# Patient Record
Sex: Female | Born: 1940 | Race: Black or African American | Hispanic: No | Smoking: Former smoker
Health system: Southern US, Community
[De-identification: ages and names within clinical notes are randomized; demographics above are authoritative.]

## PROBLEM LIST (undated history)

## (undated) ENCOUNTER — Emergency Department (HOSPITAL_COMMUNITY): Discharge status: Against Medical Advice | Payer: Medicare Other

## (undated) DIAGNOSIS — I10 Essential (primary) hypertension: Secondary | ICD-10-CM

## (undated) DIAGNOSIS — G43909 Migraine, unspecified, not intractable, without status migrainosus: Secondary | ICD-10-CM

## (undated) DIAGNOSIS — M1990 Unspecified osteoarthritis, unspecified site: Secondary | ICD-10-CM

## (undated) DIAGNOSIS — I82409 Acute embolism and thrombosis of unspecified deep veins of unspecified lower extremity: Secondary | ICD-10-CM

## (undated) DIAGNOSIS — I8390 Asymptomatic varicose veins of unspecified lower extremity: Secondary | ICD-10-CM

## (undated) DIAGNOSIS — R269 Unspecified abnormalities of gait and mobility: Secondary | ICD-10-CM

## (undated) DIAGNOSIS — F419 Anxiety disorder, unspecified: Secondary | ICD-10-CM

## (undated) DIAGNOSIS — R413 Other amnesia: Principal | ICD-10-CM

## (undated) DIAGNOSIS — E785 Hyperlipidemia, unspecified: Secondary | ICD-10-CM

## (undated) HISTORY — DX: Other amnesia: R41.3

## (undated) HISTORY — DX: Unspecified osteoarthritis, unspecified site: M19.90

## (undated) HISTORY — DX: Migraine, unspecified, not intractable, without status migrainosus: G43.909

## (undated) HISTORY — DX: Asymptomatic varicose veins of unspecified lower extremity: I83.90

## (undated) HISTORY — DX: Unspecified abnormalities of gait and mobility: R26.9

## (undated) HISTORY — PX: CHOLECYSTECTOMY: SHX55

## (undated) HISTORY — DX: Anxiety disorder, unspecified: F41.9

## (undated) HISTORY — PX: HERNIA REPAIR: SHX51

## (undated) HISTORY — PX: NECK SURGERY: SHX720

---

## 1997-10-13 ENCOUNTER — Ambulatory Visit (HOSPITAL_COMMUNITY): Admission: RE | Admit: 1997-10-13 | Discharge: 1997-10-13 | Payer: Self-pay | Admitting: Gastroenterology

## 1998-03-18 ENCOUNTER — Encounter: Payer: Self-pay | Admitting: Surgery

## 1998-03-19 ENCOUNTER — Ambulatory Visit (HOSPITAL_COMMUNITY): Admission: RE | Admit: 1998-03-19 | Discharge: 1998-03-20 | Payer: Self-pay | Admitting: Surgery

## 1998-03-19 ENCOUNTER — Encounter: Payer: Self-pay | Admitting: Surgery

## 2000-07-31 ENCOUNTER — Ambulatory Visit (HOSPITAL_BASED_OUTPATIENT_CLINIC_OR_DEPARTMENT_OTHER): Admission: RE | Admit: 2000-07-31 | Discharge: 2000-07-31 | Payer: Self-pay | Admitting: Orthopedic Surgery

## 2001-11-28 ENCOUNTER — Encounter: Payer: Self-pay | Admitting: Family Medicine

## 2001-11-28 ENCOUNTER — Ambulatory Visit (HOSPITAL_COMMUNITY): Admission: RE | Admit: 2001-11-28 | Discharge: 2001-11-28 | Payer: Self-pay | Admitting: Family Medicine

## 2002-12-29 ENCOUNTER — Ambulatory Visit (HOSPITAL_COMMUNITY): Admission: RE | Admit: 2002-12-29 | Discharge: 2002-12-29 | Payer: Self-pay | Admitting: Unknown Physician Specialty

## 2004-02-18 ENCOUNTER — Ambulatory Visit: Payer: Self-pay | Admitting: *Deleted

## 2004-03-22 ENCOUNTER — Ambulatory Visit (HOSPITAL_COMMUNITY): Admission: RE | Admit: 2004-03-22 | Discharge: 2004-03-22 | Payer: Self-pay | Admitting: Family Medicine

## 2004-12-08 ENCOUNTER — Ambulatory Visit: Payer: Self-pay | Admitting: Family Medicine

## 2004-12-13 ENCOUNTER — Ambulatory Visit: Payer: Self-pay | Admitting: Family Medicine

## 2005-04-25 ENCOUNTER — Ambulatory Visit (HOSPITAL_COMMUNITY): Admission: RE | Admit: 2005-04-25 | Discharge: 2005-04-25 | Payer: Self-pay | Admitting: Family Medicine

## 2005-05-22 ENCOUNTER — Ambulatory Visit: Payer: Self-pay | Admitting: Family Medicine

## 2005-06-22 ENCOUNTER — Ambulatory Visit: Payer: Self-pay | Admitting: Family Medicine

## 2005-11-09 ENCOUNTER — Ambulatory Visit: Payer: Self-pay | Admitting: Family Medicine

## 2005-12-25 ENCOUNTER — Ambulatory Visit: Payer: Self-pay | Admitting: Family Medicine

## 2006-04-25 ENCOUNTER — Ambulatory Visit: Payer: Self-pay | Admitting: Family Medicine

## 2006-04-30 ENCOUNTER — Ambulatory Visit (HOSPITAL_COMMUNITY): Admission: RE | Admit: 2006-04-30 | Discharge: 2006-04-30 | Payer: Self-pay | Admitting: Family Medicine

## 2006-06-25 ENCOUNTER — Ambulatory Visit: Payer: Self-pay | Admitting: Family Medicine

## 2008-01-08 ENCOUNTER — Ambulatory Visit (HOSPITAL_COMMUNITY): Admission: RE | Admit: 2008-01-08 | Discharge: 2008-01-08 | Payer: Self-pay | Admitting: Family Medicine

## 2009-02-03 ENCOUNTER — Ambulatory Visit (HOSPITAL_COMMUNITY): Admission: RE | Admit: 2009-02-03 | Discharge: 2009-02-03 | Payer: Self-pay | Admitting: Family Medicine

## 2010-02-04 ENCOUNTER — Ambulatory Visit (HOSPITAL_COMMUNITY)
Admission: RE | Admit: 2010-02-04 | Discharge: 2010-02-04 | Payer: Self-pay | Source: Home / Self Care | Attending: Family Medicine | Admitting: Family Medicine

## 2010-03-13 ENCOUNTER — Encounter: Payer: Self-pay | Admitting: Family Medicine

## 2010-07-08 NOTE — Op Note (Signed)
[place]  Patient:    Julia Patel, Julia Patel                       MRN: [id number] Proc. Date: 07/31/00 Attending:  Nicki Reaper, M.D.                           Operative Report  PREOPERATIVE DIAGNOSIS:  Carpal tunnel syndrome left hand.  POSTOPERATIVE DIAGNOSIS:  Carpal tunnel syndrome left hand.  OPERATION:  Release of left carpal tunnel.  SURGEON:  Nicki Reaper, M.D.  ASSISTANT:  Joaquin Courts, R.N.  ANESTHESIA:  Forearm-based IV regional.  ANESTHESIOLOGIST:  Halford Decamp, M.D.  HISTORY:  The patient is a 70 year old female with a history of carpal tunnel syndrome.  EMG nerve conduction is positive, which has not responded to conservative treatment.  PROCEDURE:  The patient was brought to the operating room, where a forearm-based IV regional anesthetic was carried out without difficulty.  She was prepped and draped using Betadine scrubbing solution with the left arm free.  A longitudinal incision was made in the palm and carried down through subcutaneous tissue.  Bleeders were electrocauterized.  The palmar fascia was split.  The superficial palmar arch identified.  The flexor tendon to the ring and little finger identified to the ulnar side of the median nerve.  The carpal retinaculum was incised with sharp dissection.  A right-angle and Sewall retractor were placed between skin and forearm fascia.  The fascia was released for approximately 3 cm proximal to the wrist crease under direct vision.  The canal was explored.  Tenosynovial tissue was moderately thickened.  No further lesions were identified.  The wound was irrigated.  The skin was closed with interrupted 5-0 nylon sutures.  A sterile compressive dressing and splint were applied.  The patient tolerated the procedure well and was taken to the recovery room for observation in satisfactory condition. She is discharged home.  She will return to the [facility] [place] in one  week.  She is discharged on Vicodin and Keflex. DD:  07/31/00 TD:  07/31/00 Job: [id number]

## 2011-01-25 ENCOUNTER — Other Ambulatory Visit (HOSPITAL_COMMUNITY): Payer: Self-pay | Admitting: Family Medicine

## 2011-01-25 DIAGNOSIS — Z139 Encounter for screening, unspecified: Secondary | ICD-10-CM

## 2011-02-09 ENCOUNTER — Ambulatory Visit (HOSPITAL_COMMUNITY)
Admission: RE | Admit: 2011-02-09 | Discharge: 2011-02-09 | Disposition: A | Discharge status: Home / Self Care | Payer: Medicare Other | Source: Ambulatory Visit | Attending: Family Medicine | Admitting: Family Medicine

## 2011-02-09 DIAGNOSIS — Z139 Encounter for screening, unspecified: Secondary | ICD-10-CM

## 2011-02-09 DIAGNOSIS — Z1231 Encounter for screening mammogram for malignant neoplasm of breast: Secondary | ICD-10-CM | POA: Insufficient documentation

## 2011-08-17 NOTE — Patient Instructions (Addendum)
Your procedure is scheduled on: 08/28/2011  Report to [facility] at 730       AM.  Call this number if you have problems the morning of surgery: [phone]   Do not eat food or drink liquids :After Midnight.      Take these medicines the morning of surgery with A SIP OF WATER:none   Do not wear jewelry, make-up or nail polish.  Do not wear lotions, powders, or perfumes. You may wear deodorant.  Do not shave 48 hours prior to surgery.  Do not bring valuables to the hospital.  Contacts, dentures or bridgework may not be worn into surgery.  Leave suitcase in the car. After surgery it may be brought to your room.  For patients admitted to the hospital, checkout time is 11:00 AM the day of discharge.   Patients discharged the day of surgery will not be allowed to drive home.  :     Please read over the following fact sheets that you were given: Coughing and Deep Breathing, Surgical Site Infection Prevention, Anesthesia Post-op Instructions and Care and Recovery After Surgery    Cataract A cataract is a clouding of the lens of the eye. When a lens becomes cloudy, vision is reduced based on the degree and nature of the clouding. Many cataracts reduce vision to some degree. Some cataracts make people more near-sighted as they develop. Other cataracts increase glare. Cataracts that are ignored and become worse can sometimes look white. The white color can be seen through the pupil. CAUSES   Aging. However, cataracts may occur at any age, even in newborns.   Certain drugs.   Trauma to the eye.   Certain diseases such as diabetes.   Specific eye diseases such as chronic inflammation inside the eye or a sudden attack of a rare form of glaucoma.   Inherited or acquired medical problems.  SYMPTOMS   Gradual, progressive drop in vision in the affected eye.   Severe, rapid visual loss. This most often happens when trauma is the cause.  DIAGNOSIS  To detect a cataract, an eye doctor examines  the lens. Cataracts are best diagnosed with an exam of the eyes with the pupils enlarged (dilated) by drops.  TREATMENT  For an early cataract, vision may improve by using different eyeglasses or stronger lighting. If that does not help your vision, surgery is the only effective treatment. A cataract needs to be surgically removed when vision loss interferes with your everyday activities, such as driving, reading, or watching TV. A cataract may also have to be removed if it prevents examination or treatment of another eye problem. Surgery removes the cloudy lens and usually replaces it with a substitute lens (intraocular lens, IOL).  At a time when both you and your doctor agree, the cataract will be surgically removed. If you have cataracts in both eyes, only one is usually removed at a time. This allows the operated eye to heal and be out of danger from any possible problems after surgery (such as infection or poor wound healing). In rare cases, a cataract may be doing damage to your eye. In these cases, your caregiver may advise surgical removal right away. The vast majority of people who have cataract surgery have better vision afterward. HOME CARE INSTRUCTIONS  If you are not planning surgery, you may be asked to do the following:  Use different eyeglasses.   Use stronger or brighter lighting.   Ask your eye doctor about reducing your  medicine dose or changing medicines if it is thought that a medicine caused your cataract. Changing medicines does not make the cataract go away on its own.   Become familiar with your surroundings. Poor vision can lead to injury. Avoid bumping into things on the affected side. You are at a higher risk for tripping or falling.   Exercise extreme care when driving or operating machinery.   Wear sunglasses if you are sensitive to bright light or experiencing problems with glare.  SEEK IMMEDIATE MEDICAL CARE IF:   You have a worsening or sudden vision loss.    You notice redness, swelling, or increasing pain in the eye.   You have a fever.  Document Released: 02/06/2005 Document Revised: 01/26/2011 Document Reviewed: 09/30/2010 [facility] Patient Information 2012 [place], [place].PATIENT INSTRUCTIONS POST-ANESTHESIA  IMMEDIATELY FOLLOWING SURGERY:  Do not drive or operate machinery for the first twenty four hours after surgery.  Do not make any important decisions for twenty four hours after surgery or while taking narcotic pain medications or sedatives.  If you develop intractable nausea and vomiting or a severe headache please notify your doctor immediately.  FOLLOW-UP:  Please make an appointment with your surgeon as instructed. You do not need to follow up with anesthesia unless specifically instructed to do so.  WOUND CARE INSTRUCTIONS (if applicable):  Keep a dry clean dressing on the anesthesia/puncture wound site if there is drainage.  Once the wound has quit draining you may leave it open to air.  Generally you should leave the bandage intact for twenty four hours unless there is drainage.  If the epidural site drains for more than 36-48 hours please call the anesthesia department.  QUESTIONS?:  Please feel free to call your physician or the hospital operator if you have any questions, and they will be happy to assist you.

## 2011-08-18 ENCOUNTER — Encounter (HOSPITAL_COMMUNITY): Payer: Self-pay

## 2011-08-18 ENCOUNTER — Other Ambulatory Visit: Payer: Self-pay

## 2011-08-18 ENCOUNTER — Encounter (HOSPITAL_COMMUNITY): Payer: Self-pay | Admitting: Pharmacy Technician

## 2011-08-18 ENCOUNTER — Encounter (HOSPITAL_COMMUNITY)
Admission: RE | Admit: 2011-08-18 | Discharge: 2011-08-18 | Disposition: A | Discharge status: Home / Self Care | Payer: Medicare Other | Source: Ambulatory Visit | Attending: Ophthalmology | Admitting: Ophthalmology

## 2011-08-18 HISTORY — DX: Essential (primary) hypertension: I10

## 2011-08-18 HISTORY — DX: Acute embolism and thrombosis of unspecified deep veins of unspecified lower extremity: I82.409

## 2011-08-18 HISTORY — DX: Hyperlipidemia, unspecified: E78.5

## 2011-08-18 LAB — BASIC METABOLIC PANEL
BUN: 16 mg/dL (ref 6–23)
CO2: 30 mEq/L (ref 19–32)
Calcium: 9.8 mg/dL (ref 8.4–10.5)
Chloride: 101 mEq/L (ref 96–112)
GFR calc non Af Amer: >90 mL/min (ref >90)
Glucose, Bld: 87 mg/dL (ref 70–99)

## 2011-08-25 MED ORDER — CYCLOPENTOLATE-PHENYLEPHRINE 0.2-1 % OP SOLN
OPHTHALMIC | Status: AC
  Filled 2011-08-25: qty 2

## 2011-08-25 MED ORDER — PHENYLEPHRINE HCL 2.5 % OP SOLN
OPHTHALMIC | Status: AC
  Filled 2011-08-25: qty 2

## 2011-08-25 MED ORDER — TETRACAINE HCL 0.5 % OP SOLN
OPHTHALMIC | Status: AC
  Filled 2011-08-25: qty 2

## 2011-08-25 MED ORDER — LIDOCAINE HCL (PF) 1 % IJ SOLN
INTRAMUSCULAR | Status: AC
  Filled 2011-08-25: qty 2

## 2011-08-25 MED ORDER — NEOMYCIN-POLYMYXIN-DEXAMETH 3.5-10000-0.1 OP OINT
TOPICAL_OINTMENT | OPHTHALMIC | Status: AC
  Filled 2011-08-25: qty 3.5

## 2011-08-25 MED ORDER — LIDOCAINE HCL 3.5 % OP GEL
OPHTHALMIC | Status: AC
  Filled 2011-08-25: qty 5

## 2011-08-28 ENCOUNTER — Encounter (HOSPITAL_COMMUNITY): Payer: Self-pay | Admitting: Anesthesiology

## 2011-08-28 ENCOUNTER — Ambulatory Visit (HOSPITAL_COMMUNITY)
Admission: RE | Admit: 2011-08-28 | Discharge: 2011-08-28 | Disposition: A | Discharge status: Home Health | Payer: Medicare Other | Source: Ambulatory Visit | Attending: Ophthalmology | Admitting: Ophthalmology

## 2011-08-28 ENCOUNTER — Encounter (HOSPITAL_COMMUNITY)
Admission: RE | Disposition: A | Discharge status: Home Health | Payer: Self-pay | Source: Ambulatory Visit | Attending: Ophthalmology

## 2011-08-28 ENCOUNTER — Encounter (HOSPITAL_COMMUNITY): Payer: Self-pay | Admitting: *Deleted

## 2011-08-28 ENCOUNTER — Encounter (HOSPITAL_COMMUNITY): Payer: Self-pay | Admitting: Ophthalmology

## 2011-08-28 ENCOUNTER — Ambulatory Visit (HOSPITAL_COMMUNITY): Payer: Medicare Other | Admitting: Anesthesiology

## 2011-08-28 DIAGNOSIS — Z79899 Other long term (current) drug therapy: Secondary | ICD-10-CM | POA: Insufficient documentation

## 2011-08-28 DIAGNOSIS — I10 Essential (primary) hypertension: Secondary | ICD-10-CM | POA: Insufficient documentation

## 2011-08-28 DIAGNOSIS — H2589 Other age-related cataract: Secondary | ICD-10-CM | POA: Insufficient documentation

## 2011-08-28 DIAGNOSIS — F17200 Nicotine dependence, unspecified, uncomplicated: Secondary | ICD-10-CM | POA: Insufficient documentation

## 2011-08-28 DIAGNOSIS — Z01812 Encounter for preprocedural laboratory examination: Secondary | ICD-10-CM | POA: Insufficient documentation

## 2011-08-28 DIAGNOSIS — Z01810 Encounter for preprocedural cardiovascular examination: Secondary | ICD-10-CM | POA: Insufficient documentation

## 2011-08-28 HISTORY — PX: CATARACT EXTRACTION W/PHACO: SHX586

## 2011-08-28 SURGERY — PHACOEMULSIFICATION, CATARACT, WITH IOL INSERTION
Anesthesia: Monitor Anesthesia Care | Site: Eye | Laterality: Right | Wound class: Clean

## 2011-08-28 MED ORDER — PHENYLEPHRINE HCL 2.5 % OP SOLN
1.0000 [drp] | OPHTHALMIC | Status: AC
  Administered 2011-08-28 (×3): 1 [drp] via OPHTHALMIC

## 2011-08-28 MED ORDER — PROVISC 10 MG/ML IO SOLN
INTRAOCULAR | Status: DC | PRN
  Administered 2011-08-28: 8.5 mg via INTRAOCULAR

## 2011-08-28 MED ORDER — MIDAZOLAM HCL 2 MG/2ML IJ SOLN
INTRAMUSCULAR | Status: AC
  Administered 2011-08-28: 2 mg via INTRAVENOUS
  Filled 2011-08-28: qty 2

## 2011-08-28 MED ORDER — LACTATED RINGERS IV SOLN: INTRAVENOUS | Status: DC

## 2011-08-28 MED ORDER — MIDAZOLAM HCL 2 MG/2ML IJ SOLN
1.0000 mg | INTRAMUSCULAR | Status: DC | PRN
  Administered 2011-08-28: 2 mg via INTRAVENOUS

## 2011-08-28 MED ORDER — GLYCOPYRROLATE 0.2 MG/ML IJ SOLN
INTRAMUSCULAR | Status: AC
  Filled 2011-08-28: qty 1

## 2011-08-28 MED ORDER — EPINEPHRINE HCL 1 MG/ML IJ SOLN
INTRAMUSCULAR | Status: AC
  Filled 2011-08-28: qty 1

## 2011-08-28 MED ORDER — LIDOCAINE HCL 3.5 % OP GEL
1.0000 "application " | Freq: Once | OPHTHALMIC | Status: AC
  Administered 2011-08-28: 1 via OPHTHALMIC

## 2011-08-28 MED ORDER — LIDOCAINE 3.5 % OP GEL OPTIME - NO CHARGE
OPHTHALMIC | Status: DC | PRN
  Administered 2011-08-28: 1 [drp] via OPHTHALMIC

## 2011-08-28 MED ORDER — POVIDONE-IODINE 5 % OP SOLN
OPHTHALMIC | Status: DC | PRN
  Administered 2011-08-28: 1 via OPHTHALMIC

## 2011-08-28 MED ORDER — LIDOCAINE HCL (PF) 1 % IJ SOLN
INTRAMUSCULAR | Status: DC | PRN
  Administered 2011-08-28: .3 mL

## 2011-08-28 MED ORDER — BSS IO SOLN
INTRAOCULAR | Status: DC | PRN
  Administered 2011-08-28: 15 mL via INTRAOCULAR

## 2011-08-28 MED ORDER — CYCLOPENTOLATE-PHENYLEPHRINE 0.2-1 % OP SOLN
1.0000 [drp] | OPHTHALMIC | Status: AC
  Administered 2011-08-28 (×3): 1 [drp] via OPHTHALMIC

## 2011-08-28 MED ORDER — EPINEPHRINE HCL 1 MG/ML IJ SOLN
INTRAOCULAR | Status: DC | PRN
  Administered 2011-08-28: 09:00:00

## 2011-08-28 MED ORDER — GLYCOPYRROLATE 0.2 MG/ML IJ SOLN
0.2000 mg | Freq: Once | INTRAMUSCULAR | Status: AC
  Administered 2011-08-28: 0.2 mg via INTRAVENOUS

## 2011-08-28 MED ORDER — TETRACAINE HCL 0.5 % OP SOLN
1.0000 [drp] | OPHTHALMIC | Status: AC
  Administered 2011-08-28 (×3): 1 [drp] via OPHTHALMIC

## 2011-08-28 MED ORDER — NEOMYCIN-POLYMYXIN-DEXAMETH 0.1 % OP OINT
TOPICAL_OINTMENT | OPHTHALMIC | Status: DC | PRN
  Administered 2011-08-28: 1 via OPHTHALMIC

## 2011-08-28 MED ORDER — LACTATED RINGERS IV SOLN
INTRAVENOUS | Status: DC | PRN
  Administered 2011-08-28: 09:00:00 via INTRAVENOUS

## 2011-08-28 SURGICAL SUPPLY — 32 items
CAPSULAR TENSION RING-AMO (OPHTHALMIC RELATED) IMPLANT
CLOTH BEACON ORANGE TIMEOUT ST (SAFETY) ×1 IMPLANT
EYE SHIELD UNIVERSAL CLEAR (GAUZE/BANDAGES/DRESSINGS) ×2 IMPLANT
GLOVE BIO SURGEON STRL SZ 6.5 (GLOVE) IMPLANT
GLOVE BIOGEL PI IND STRL 6.5 (GLOVE) IMPLANT
GLOVE BIOGEL PI IND STRL 7.0 (GLOVE) IMPLANT
GLOVE BIOGEL PI IND STRL 7.5 (GLOVE) IMPLANT
GLOVE BIOGEL PI INDICATOR 6.5 (GLOVE) ×1
GLOVE BIOGEL PI INDICATOR 7.0 (GLOVE)
GLOVE BIOGEL PI INDICATOR 7.5 (GLOVE)
GLOVE ECLIPSE 6.5 STRL STRAW (GLOVE) IMPLANT
GLOVE ECLIPSE 7.0 STRL STRAW (GLOVE) IMPLANT
GLOVE ECLIPSE 7.5 STRL STRAW (GLOVE) IMPLANT
GLOVE EXAM NITRILE LRG STRL (GLOVE) IMPLANT
GLOVE EXAM NITRILE MD LF STRL (GLOVE) ×1 IMPLANT
GLOVE SKINSENSE NS SZ6.5 (GLOVE)
GLOVE SKINSENSE NS SZ7.0 (GLOVE)
GLOVE SKINSENSE STRL SZ6.5 (GLOVE) IMPLANT
GLOVE SKINSENSE STRL SZ7.0 (GLOVE) IMPLANT
KIT VITRECTOMY (OPHTHALMIC RELATED) IMPLANT
PAD ARMBOARD 7.5X6 YLW CONV (MISCELLANEOUS) ×1 IMPLANT
PROC W NO LENS (INTRAOCULAR LENS)
PROC W SPEC LENS (INTRAOCULAR LENS)
PROCESS W NO LENS (INTRAOCULAR LENS) IMPLANT
PROCESS W SPEC LENS (INTRAOCULAR LENS) IMPLANT
RING MALYGIN (MISCELLANEOUS) IMPLANT
SIGHTPATH CAT PROC W REG LENS (Ophthalmic Related) ×2 IMPLANT
SYR TB 1ML LL NO SAFETY (SYRINGE) ×1 IMPLANT
TAPE SURG TRANSPORE 1 IN (GAUZE/BANDAGES/DRESSINGS) IMPLANT
TAPE SURGICAL TRANSPORE 1 IN (GAUZE/BANDAGES/DRESSINGS) ×1
VISCOELASTIC ADDITIONAL (OPHTHALMIC RELATED) IMPLANT
WATER STERILE IRR 250ML POUR (IV SOLUTION) ×1 IMPLANT

## 2011-08-28 NOTE — Transfer of Care (Signed)
Immediate Anesthesia Transfer of Care Note  Patient: Julia Patel  Procedure(s) Performed: Procedure(s) (LRB): CATARACT EXTRACTION PHACO AND INTRAOCULAR LENS PLACEMENT (IOC) (Right)  Patient Location: PACU  Anesthesia Type: MAC  Level of Consciousness: awake, alert , oriented and patient cooperative  Airway & Oxygen Therapy: Patient Spontanous Breathing  Post-op Assessment: Report given to PACU RN and Post -op Vital signs reviewed and stable  Post vital signs: Reviewed and stable  Complications: No apparent anesthesia complications

## 2011-08-28 NOTE — Anesthesia Procedure Notes (Signed)
Procedure Name: MAC Date/Time: 08/28/2011 9:16 AM Performed by: Carolyne Littles, Danial Hlavac L Pre-anesthesia Checklist: Patient identified, Patient being monitored, Emergency Drugs available, Timeout performed and Suction available Oxygen Delivery Method: Nasal cannula

## 2011-08-28 NOTE — H&P (Signed)
I have reviewed the H&P, the patient was re-examined, and I have identified no interval changes in medical condition and plan of care since the history and physical of record  

## 2011-08-28 NOTE — Brief Op Note (Signed)
Pre-Op Dx: Cataract OD Post-Op Dx: Cataract OD Surgeon: Dalton Molesworth Anesthesia: Topical with MAC Surgery: Cataract Extraction with Intraocular lens Implant OD Implant: Lenstec, Model Softec HD Blood Loss: None Specimen: None Complications: None 

## 2011-08-28 NOTE — Preoperative (Signed)
Beta Blockers   Reason not to administer Beta Blockers:Not Applicable 

## 2011-08-28 NOTE — Anesthesia Postprocedure Evaluation (Signed)
  Anesthesia Post-op Note  Patient: Julia Patel  Procedure(s) Performed: Procedure(s) (LRB): CATARACT EXTRACTION PHACO AND INTRAOCULAR LENS PLACEMENT (IOC) (Right)  Patient Location: PACU  Anesthesia Type: MAC  Level of Consciousness: awake, alert , oriented and patient cooperative  Airway and Oxygen Therapy: Patient Spontanous Breathing  Post-op Pain: none  Post-op Assessment: Post-op Vital signs reviewed, Patient's Cardiovascular Status Stable, Respiratory Function Stable and Patent Airway  Post-op Vital Signs: Reviewed and stable  Complications: No apparent anesthesia complications

## 2011-08-28 NOTE — Anesthesia Preprocedure Evaluation (Signed)
Anesthesia Evaluation  Patient identified by MRN, date of birth, ID band Patient awake    Reviewed: Allergy & Precautions, H&P , NPO status , Patient's Chart, lab work & pertinent test results  History of Anesthesia Complications Negative for: history of anesthetic complications  Airway Mallampati: I TM Distance: >3 FB Neck ROM: Full    Dental No notable dental hx.    Pulmonary neg pulmonary ROS, Current Smoker,    Pulmonary exam normal       Cardiovascular hypertension, Pt. on medications Rhythm:Regular Rate:Normal     Neuro/Psych negative neurological ROS  negative psych ROS   GI/Hepatic negative GI ROS, Neg liver ROS,   Endo/Other  negative endocrine ROS  Renal/GU negative Renal ROS     Musculoskeletal negative musculoskeletal ROS (+)   Abdominal Normal abdominal exam  (+)   Peds  Hematology negative hematology ROS (+)   Anesthesia Other Findings   Reproductive/Obstetrics                           Anesthesia Physical Anesthesia Plan  ASA: II  Anesthesia Plan: MAC   Post-op Pain Management:    Induction: Intravenous  Airway Management Planned: Nasal Cannula  Additional Equipment:   Intra-op Plan:   Post-operative Plan:   Informed Consent: I have reviewed the patients History and Physical, chart, labs and discussed the procedure including the risks, benefits and alternatives for the proposed anesthesia with the patient or authorized representative who has indicated his/her understanding and acceptance.     Plan Discussed with: CRNA  Anesthesia Plan Comments:         Anesthesia Quick Evaluation

## 2011-08-29 NOTE — Op Note (Signed)
NAMELORALEI, RADCLIFFE              ACCOUNT NO.:  [id number]  MEDICAL RECORD NO.:  [id number]  LOCATION:  [facility]                          FACILITY:  [facility]  PHYSICIAN:  Susanne Greenhouse, MD       DATE OF BIRTH:  02-22-1940  DATE OF PROCEDURE:  08/28/2011 DATE OF DISCHARGE:  08/28/2011                              OPERATIVE REPORT   PREOPERATIVE DIAGNOSIS:  Combined cataract, right eye, diagnosis code 366.19.  POSTOPERATIVE DIAGNOSIS:  Combined cataract, right eye, diagnosis code 366.19.  OPERATION PERFORMED:  Phacoemulsification with posterior chamber intraocular lens implantation, right eye.  SURGEON:  Bonne Dolores. Zymier Rodgers, MD  ANESTHESIA:  Topical with monitored anesthesia care and IV sedation.  OPERATIVE SUMMARY:  In the preoperative area, dilating drops were placed into the right eye.  The patient was then brought into the operating room where she was placed under general anesthesia.  The eye was then prepped and draped.  Beginning with a #75 blade, a paracentesis port was made at the surgeon's 2 o'clock position.  The anterior chamber was then filled with a 1% nonpreserved lidocaine solution with epinephrine.  This was followed by Viscoat to deepen the chamber.  A small fornix-based peritomy was performed superiorly.  Next, a single iris hook was placed through the limbus superiorly.  A 2.4-mm keratome blade was then used to make a clear corneal incision over the iris hook.  A bent cystotome needle and Utrata forceps were used to create a continuous tear capsulotomy.  Hydrodissection was performed using balanced salt solution on a fine cannula.  The lens nucleus was then removed using phacoemulsification in a quadrant cracking technique.  The cortical material was then removed with irrigation and aspiration.  The capsular bag and anterior chamber were refilled with Provisc.  The wound was widened to approximately 3 mm and a posterior chamber intraocular lens was placed into the capsular  bag without difficulty using an [employer] lens injecting system.  A single 10-0 nylon suture was then used to close the incision as well as stromal hydration.  The Provisc was removed from the anterior chamber and capsular bag with irrigation and aspiration.  At this point, the wounds were tested for leak, which were negative.  The anterior chamber remained deep and stable.  The patient tolerated the procedure well.  There were no operative complications, and she awoke from general anesthesia without problem.  No surgical specimens.  Prosthetic device used is a Lenstec posterior chamber lens, model Softec HD, power of 20.5, serial number is [id number].          ______________________________ Susanne Greenhouse, MD     KEH/MEDQ  D:  08/28/2011  T:  08/29/2011  Job:  [id number]

## 2011-08-31 ENCOUNTER — Encounter (HOSPITAL_COMMUNITY): Payer: Self-pay | Admitting: Ophthalmology

## 2012-01-15 ENCOUNTER — Other Ambulatory Visit (HOSPITAL_COMMUNITY): Payer: Self-pay | Admitting: Family Medicine

## 2012-01-15 DIAGNOSIS — Z139 Encounter for screening, unspecified: Secondary | ICD-10-CM

## 2012-02-12 ENCOUNTER — Ambulatory Visit (HOSPITAL_COMMUNITY)
Admission: RE | Admit: 2012-02-12 | Discharge: 2012-02-12 | Disposition: A | Discharge status: Home / Self Care | Payer: Medicare Other | Source: Ambulatory Visit | Attending: Family Medicine | Admitting: Family Medicine

## 2012-02-12 DIAGNOSIS — Z139 Encounter for screening, unspecified: Secondary | ICD-10-CM

## 2012-02-12 DIAGNOSIS — Z1231 Encounter for screening mammogram for malignant neoplasm of breast: Secondary | ICD-10-CM | POA: Insufficient documentation

## 2012-09-06 ENCOUNTER — Inpatient Hospital Stay (HOSPITAL_COMMUNITY): Admission: RE | Admit: 2012-09-06 | Payer: Medicare Other | Source: Ambulatory Visit

## 2012-09-06 ENCOUNTER — Telehealth (HOSPITAL_COMMUNITY): Payer: Self-pay

## 2012-09-09 ENCOUNTER — Ambulatory Visit (HOSPITAL_COMMUNITY): Payer: Medicare Other

## 2012-09-10 ENCOUNTER — Ambulatory Visit (HOSPITAL_COMMUNITY)
Admission: RE | Admit: 2012-09-10 | Discharge: 2012-09-10 | Disposition: A | Discharge status: Home / Self Care | Payer: Medicare Other | Source: Ambulatory Visit | Attending: Preventative Medicine | Admitting: Preventative Medicine

## 2012-09-10 DIAGNOSIS — M25519 Pain in unspecified shoulder: Secondary | ICD-10-CM | POA: Insufficient documentation

## 2012-09-10 DIAGNOSIS — IMO0001 Reserved for inherently not codable concepts without codable children: Secondary | ICD-10-CM | POA: Insufficient documentation

## 2012-09-10 DIAGNOSIS — I10 Essential (primary) hypertension: Secondary | ICD-10-CM | POA: Insufficient documentation

## 2012-09-10 NOTE — Evaluation (Signed)
**Note Julia-Identified via Obfuscation** Physical Therapy Evaluation  Patient Details  Name: Julia Patel MRN: [id number] Date of Birth: [birth year]  Today's Date: 09/10/2012 Time: 0855-0940 PT Time Calculation (min): 45 min Charge: evaluation             Visit#: 1 of 3  Re-eval:   Assessment Diagnosis: L shoulder impingement Next MD Visit: 10/13/2012 Prior Therapy: none  Authorization: medicare blue       Authorization Visit#: 1 of 3 (total ordered by MD)   Past Medical History:  Past Medical History  Diagnosis Date  . Hypertension   . Hyperlipemia   . DVT (deep venous thrombosis)     rt   Past Surgical History:  Past Surgical History  Procedure Laterality Date  . Hernia repair      [facility]  . Cataract extraction w/phaco  08/28/2011    Procedure: CATARACT EXTRACTION PHACO AND INTRAOCULAR LENS PLACEMENT (IOC);  Surgeon: Gemma Payor, MD;  Location: [facility];  Service: Ophthalmology;  Laterality: Right;  CDE:14.42    Subjective Symptoms/Limitations Symptoms: Julia Patel states that she fell on her Lt shoulder after tripping on a rug at work a week ago.  She states that the pain is about the same.  She states she has a little pain at rest but the pain increases when she goes to use it.  The patient states that she is waking up one time a night.  She has taken medication that  help but the pain always comes back therefore she is being referred to therapy to return her to prior functional level.   Pain Assessment Currently in Pain?: Yes (at rest pain is a 3/10) Pain Score: 3  (works is an 8/10) Pain Location: Shoulder Pain Orientation: Left Pain Type: Acute pain Pain Radiating Towards: To deltoid tuboroisity Pain Onset: 1 to 4 weeks ago Pain Frequency: Constant Pain Relieving Factors: none Effect of Pain on Daily Activities: increases   Balance Screening:  Julia Patel one time due to tripping on rug. 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 Leisure: Hobbies-no  Cognition/Observation Cognition Overall Cognitive Status: Within Functional Limits for tasks assessed  Assessment LUE AROM (degrees) Left Shoulder Flexion: 95 Degrees Left Shoulder ABduction: 85 Degrees Left Shoulder Internal Rotation: 70 Degrees Left Shoulder External Rotation: 55 Degrees LUE Strength Left Shoulder Flexion: 3/5 (in available range) Left Shoulder ABduction: 3/5 (in available range) Left Shoulder Internal Rotation: 3+/5 Left Shoulder External Rotation: 3-/5  Exercise/Treatments  Supine External Rotation: 10 reps Internal Rotation: 10 reps Flexion: AAROM;10 reps ABduction: AAROM;10 reps Seated External Rotation: 10 reps  There are no active problems to display for this patient.   PT - End of Session Activity Tolerance: Patient tolerated treatment well PT Plan of Care PT Home Exercise Plan: given See pt three times a week x one week. Assessment:  Pt with hx of a fall who's exam demonstrates decreased strength, decreased ROM and increased pain with functional use.  Pt will benefit from skilled PT to address these issues and return to prior functional level.   Plan:  Begin working with ROM UBE, pulley, wall walking and ball exercises next treatment; progress to sitting flexion and abduction activities. GP Functional Assessment Tool Used: clinical judgement Functional Limitation: Self care Self Care Current Status [phone]): At least 40 percent but less than 60 percent impaired, limited or restricted Self Care Goal Status (X9147): At least 20 percent but less than 40 percent  impaired, limited or restricted  Zamire Whitehurst,CINDY 09/10/2012, 11:36 AM  Physician Documentation Your signature is required to indicate approval of the treatment plan as stated above.  Please sign and either send electronically or make a copy of this report for your files and return this physician signed original.   Please mark  one 1.__approve of plan  2. ___approve of plan with the following conditions.   ______________________________                                                          _____________________ Physician Signature                                                                                                             Date

## 2012-09-11 ENCOUNTER — Ambulatory Visit (HOSPITAL_COMMUNITY): Payer: Medicare Other

## 2012-09-12 ENCOUNTER — Ambulatory Visit (HOSPITAL_COMMUNITY)
Admission: RE | Admit: 2012-09-12 | Discharge: 2012-09-12 | Disposition: A | Discharge status: Home / Self Care | Payer: Medicare Other | Source: Ambulatory Visit | Attending: Preventative Medicine | Admitting: Preventative Medicine

## 2012-09-12 NOTE — Progress Notes (Signed)
Physical Therapy Treatment Patient Details  Name: NEIDA ELLEGOOD MRN: [id number] Date of Birth: Jan 23, 1941  Today's Date: 09/12/2012 Time: 4098-1191 PT Time Calculation (min): 38 min Charges: Therex x 38' (1108-1146)  Visit#: 2 of 3   Authorization: medicare blue  Authorization Visit#: 2 of 3 (total ordered by MD)   Subjective: Symptoms/Limitations Symptoms: Pt stated she only has pain when reaching. She states that this pain is usually a 6 or 7/10.  Pain Assessment Currently in Pain?: No/denies   Exercise/Treatments Seated External Rotation: 10 reps Internal Rotation: 10 reps Flexion: 10 reps Abduction: 10 reps Other Seated Exercises: PROM all directions Pulleys Flexion: 2 minutes ABduction: 2 minutes Therapy Ball Flexion: 10 reps ABduction: 10 reps Right/Left: 5 reps ROM / Strengthening / Isometric Strengthening UBE (Upper Arm Bike): 4'@1 .0  for strength/ROM   Physical Therapy Assessment and Plan PT Assessment and Plan Clinical Impression Statement: Progress strength and ROM exercises with multimodal cueing for proper technique. Educated pt on importance of completing ROM exercises to maintain ROM and avoid adhesive capsulitis. Pt is without complaint throughout session and reports decreased tightness and pain with movement at end of session. Pt will benefit from skilled therapeutic intervention in order to improve on the following deficits: Decreased activity tolerance;Decreased strength;Decreased range of motion;Pain PT Frequency: Min 3X/week PT Duration:  (1 week) PT Treatment/Interventions: Therapeutic exercise;Therapeutic activities;Modalities;Manual techniques PT Plan: Reassess next session.      Problem List There are no active problems to display for this patient.   PT - End of Session Activity Tolerance: Patient tolerated treatment well General Behavior During Therapy: [facility] for tasks assessed/performed  Seth Bake, PTA 09/12/2012, 12:11 PM

## 2012-09-16 ENCOUNTER — Ambulatory Visit (HOSPITAL_COMMUNITY)
Admission: RE | Admit: 2012-09-16 | Discharge: 2012-09-16 | Disposition: A | Discharge status: Home / Self Care | Payer: Medicare Other | Source: Ambulatory Visit | Attending: Family Medicine | Admitting: Family Medicine

## 2012-09-16 NOTE — Progress Notes (Signed)
Physical Therapy Treatment Patient Details  Name: Julia Patel MRN: [id number] Date of Birth: 11-Sep-1940  Today's Date: 09/16/2012 Time: 0945-1020 PT Time Calculation (min): 35 min  Visit#: 3 of 3  Re-eval:      Authorization:    Authorization Time Period:    Authorization Visit#: 3 of 3   Subjective: Symptoms/Limitations Symptoms: Julia Patel states that she has improved quite a bit. Pain Assessment Currently in Pain?: No/denies   Exercise/Treatments   Seated Retraction: 10 reps External Rotation: 10 reps;Weights External Rotation Weight (lbs): 2# Internal Rotation: 10 reps Flexion: 10 reps Abduction: 10 reps Prone  External Rotation: 5 reps Horizontal ABduction 1: 5 reps Sidelying External Rotation: 5 reps   Pulleys Flexion: 2 minutes ABduction: 2 minutes Therapy Ball Flexion: 10 reps ABduction: 10 reps Right/Left: 5 reps ROM / Strengthening / Isometric Strengthening UBE (Upper Arm Bike): 4'@1 .0    Manual Therapy Manual Therapy: Other (comment) Other Manual Therapy: Laser for mm/tendon pain acute continuous at 45 J/cm2; 1:05 per treatment.  Pt treatment to bicipital groove with arm externally rotatted  two additional treatments to supraspinatus mm.  Physical Therapy Assessment and Plan PT Assessment and Plan Clinical Impression Statement: Pt currently has full ROM but continues to have decreased strength.  Pt to MD tomorrow.  Order was for 3 visits which pt has had; if MD reorders continue with strengthening and stabilization exercises. PT Plan: await MD order    Goals  progressing  Problem List There are no active problems to display for this patient.   PT - End of Session Activity Tolerance: Patient tolerated treatment well  GP Functional Assessment Tool Used: clinical judgement Self Care Current Status (J1914): At least 20 percent but less than 40 percent impaired, limited or restricted Self Care Goal Status (N8295): At least 1 percent but  less than 20 percent impaired, limited or restricted Self Care Discharge Status [phone]): At least 40 percent but less than 60 percent impaired, limited or restricted  RUSSELL,CINDY 09/16/2012, 10:34 AM

## 2013-02-20 ENCOUNTER — Encounter (HOSPITAL_COMMUNITY): Payer: Self-pay | Admitting: Emergency Medicine

## 2013-02-20 ENCOUNTER — Emergency Department (HOSPITAL_COMMUNITY)
Admission: EM | Admit: 2013-02-20 | Discharge: 2013-02-20 | Disposition: A | Discharge status: Home / Self Care | Payer: Medicare HMO | Attending: Emergency Medicine | Admitting: Emergency Medicine

## 2013-02-20 ENCOUNTER — Emergency Department (HOSPITAL_COMMUNITY): Payer: Medicare HMO

## 2013-02-20 DIAGNOSIS — Z9849 Cataract extraction status, unspecified eye: Secondary | ICD-10-CM | POA: Insufficient documentation

## 2013-02-20 DIAGNOSIS — Z87891 Personal history of nicotine dependence: Secondary | ICD-10-CM | POA: Insufficient documentation

## 2013-02-20 DIAGNOSIS — R269 Unspecified abnormalities of gait and mobility: Secondary | ICD-10-CM | POA: Insufficient documentation

## 2013-02-20 DIAGNOSIS — R110 Nausea: Secondary | ICD-10-CM | POA: Insufficient documentation

## 2013-02-20 DIAGNOSIS — R42 Dizziness and giddiness: Secondary | ICD-10-CM

## 2013-02-20 DIAGNOSIS — I10 Essential (primary) hypertension: Secondary | ICD-10-CM | POA: Insufficient documentation

## 2013-02-20 DIAGNOSIS — Z86718 Personal history of other venous thrombosis and embolism: Secondary | ICD-10-CM | POA: Insufficient documentation

## 2013-02-20 DIAGNOSIS — E785 Hyperlipidemia, unspecified: Secondary | ICD-10-CM | POA: Insufficient documentation

## 2013-02-20 LAB — POCT I-STAT, CHEM 8
BUN: 15 mg/dL (ref 6–23)
CALCIUM ION: 1.25 mmol/L (ref 1.13–1.30)
CREATININE: 0.7 mg/dL (ref 0.50–1.10)
Chloride: 99 mEq/L (ref 96–112)
GLUCOSE: 90 mg/dL (ref 70–99)
HCT: 47 % — ABNORMAL HIGH (ref 36.0–46.0)
Hemoglobin: 16 g/dL — ABNORMAL HIGH (ref 12.0–15.0)
Potassium: 4 mEq/L (ref 3.7–5.3)
Sodium: 140 mEq/L (ref 137–147)
TCO2: 30 mmol/L (ref 0–100)

## 2013-02-20 LAB — GLUCOSE, CAPILLARY: GLUCOSE-CAPILLARY: 79 mg/dL (ref 70–99)

## 2013-02-20 MED ORDER — METOCLOPRAMIDE HCL 5 MG/ML IJ SOLN
10.0000 mg | Freq: Once | INTRAMUSCULAR | Status: AC
  Administered 2013-02-20: 10 mg via INTRAMUSCULAR
  Filled 2013-02-20: qty 2

## 2013-02-20 MED ORDER — MECLIZINE HCL 25 MG PO TABS: 25.0000 mg | ORAL_TABLET | Freq: Four times a day (QID) | ORAL | Status: DC | PRN

## 2013-02-20 MED ORDER — MECLIZINE HCL 25 MG PO TABS
25.0000 mg | ORAL_TABLET | Freq: Once | ORAL | Status: AC
  Administered 2013-02-20: 25 mg via ORAL
  Filled 2013-02-20: qty 1

## 2013-02-20 MED ORDER — DIAZEPAM 5 MG PO TABS: 5.0000 mg | ORAL_TABLET | Freq: Four times a day (QID) | ORAL | Status: DC | PRN

## 2013-02-20 NOTE — ED Notes (Signed)
Dr Fonnie JarvisBednar attempted to stand pt up.  PT states she is no longer busy when standing, but is unsteady and unable to stand on her own.

## 2013-02-20 NOTE — ED Notes (Signed)
Pt reports woke up with dizziness at 0900 today. Pt tried to get out of bed and fell. Pt denies head injury during fall.  Pt denies pain at present. Grips equal B/L, No facial droop. Speech clear.

## 2013-02-20 NOTE — ED Notes (Signed)
Pt to MRI at this time.  Family remains at bedside.

## 2013-02-20 NOTE — Discharge Instructions (Signed)
Your exam shows you have had an episode of vertigo, which causes a false sense of movement such as a spinning feeling or walls that seem to move.  Most vertigo is caused by a (usually temporary) problem in the inner ear. Rarely, the back part of the brain can cause vertigo (some mini-strokes / TIA's / strokes), but it appears to be a low risk cause for you at this time. It is important to follow-up with your doctor however, to see if you need further testing.  °Do not drive or participate in potentially dangerous activities requiring balance unless off meds (not drowsy) and the vertigo has resolved. °Most of the time benign vertigo is much better after a few days. However, mild unsteadiness may last for up to 3 months in some patients. An MRI scan or other special tests to evaluate your hearing and balance may be needed if the vertigo does not improve or returns in the future. RETURN IMMEDIATELY IF YOU HAVE ANY OF THE FOLLOWING (call 911): °Increasing vertigo, earache, ear drainage, or loss of hearing.  °Severe headache, blurred or double vision, or trouble walking.  °Fainting or poorly responsive, extreme weakness, chest pain, or palpitations.  °Fever, persistent vomiting, or dehydration.  °Numbness, tingling, incoordination, or weakness of the limbs.  °Change in speech, vision, swallowing, understanding, or other concerns. °

## 2013-02-20 NOTE — ED Notes (Signed)
Pt remains in MRI at this time  

## 2013-02-20 NOTE — ED Provider Notes (Signed)
CSN: [id number]     Arrival date & time 02/20/13  1231 History   First MD Initiated Contact with Patient 02/20/13 1310     Chief Complaint  Patient presents with  . Dizziness   (Consider location/radiation/quality/duration/timing/severity/associated sxs/prior Treatment) HPI History of vertigo in the past, went to bed feeling normal at 12:30 this morning, woke up this morning with intermittent positional vertigo with nausea no vomiting no headache no confusion no change in speech or vision no change in swallowing no focal weakness or numbness no incoordination of arms or legs except when trying to walk needs some assistance due to vertigo  Normal neuro did not test walking initially negative test skew no nystagmus Past Medical History  Diagnosis Date  . Hypertension   . Hyperlipemia   . DVT (deep venous thrombosis)     rt   Past Surgical History  Procedure Laterality Date  . Hernia repair      [facility]  . Cataract extraction w/phaco  08/28/2011    Procedure: CATARACT EXTRACTION PHACO AND INTRAOCULAR LENS PLACEMENT (IOC);  Surgeon: Gemma PayorKerry Hunt, MD;  Location: [facility];  Service: Ophthalmology;  Laterality: Right;  CDE:14.42   No family history on file. History  Substance Use Topics  . Smoking status: Former Games developermoker  . Smokeless tobacco: Not on file  . Alcohol Use: No   OB History   Grav Para Term Preterm Abortions TAB SAB Ect Mult Living                 Review of Systems 10 Systems reviewed and are negative for acute change except as noted in the HPI. Allergies  Review of patient's allergies indicates no known allergies.  Home Medications   Current Outpatient Rx  Name  Route  Sig  Dispense  Refill  . diazepam (VALIUM) 5 MG tablet   Oral   Take 1 tablet (5 mg total) by mouth every 6 (six) hours as needed (vertigo).   10 tablet   0   . meclizine (ANTIVERT) 25 MG tablet   Oral   Take 1 tablet (25 mg total) by mouth every 6 (six) hours as needed for dizziness.   20  tablet   0    BP 146/79  Pulse 59  Temp(Src) 98.1 F (36.7 C) (Oral)  Resp 16  Ht 5' (1.524 m)  Wt 140 lb (63.504 kg)  BMI 27.34 kg/m2  SpO2 100% Physical Exam  Nursing note and vitals reviewed. Constitutional:  Awake, alert, nontoxic appearance with baseline speech for patient.  HENT:  Head: Atraumatic.  Mouth/Throat: No oropharyngeal exudate.  Eyes: EOM are normal. Pupils are equal, round, and reactive to light. Right eye exhibits no discharge. Left eye exhibits no discharge.  Neck: Neck supple.  Cardiovascular: Normal rate and regular rhythm.   No murmur heard. Pulmonary/Chest: Effort normal and breath sounds normal. No stridor. No respiratory distress. She has no wheezes. She has no rales. She exhibits no tenderness.  Abdominal: Soft. Bowel sounds are normal. She exhibits no mass. There is no tenderness. There is no rebound.  Musculoskeletal: She exhibits no tenderness.  Baseline ROM, moves extremities with no obvious new focal weakness.  Lymphadenopathy:    She has no cervical adenopathy.  Neurological: She is alert.  Awake, alert, cooperative and aware of situation; motor strength 5/5 bilaterally; sensation normal to light touch bilaterally; peripheral visual fields full to confrontation; no facial asymmetry; tongue midline; major cranial nerves appear intact; no pronator drift, normal finger to nose  bilaterally, no nystagmus negative test of skew  Skin: No rash noted.  Psychiatric: She has a normal mood and affect.    ED Course  Procedures (including critical care time) No vertigo but ataxic gait after initial meds so MRI obtained. Patient / Family / Caregiver informed of clinical course, understand medical decision-making process, and agree with plan. Labs Review Labs Reviewed  POCT I-STAT, CHEM 8 - Abnormal; Notable for the following:    Hemoglobin 16.0 (*)    HCT 47.0 (*)    All other components within normal limits  GLUCOSE, CAPILLARY   Imaging Review Mr  Brain Wo Contrast  02/20/2013   CLINICAL DATA:  Gait disturbance.  Dizziness.  EXAM: MRI HEAD WITHOUT CONTRAST  TECHNIQUE: Multiplanar, multiecho pulse sequences of the brain and surrounding structures were obtained without intravenous contrast.  COMPARISON:  None.  FINDINGS: Diffusion imaging does not show any acute or subacute infarction. 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 There are a few old small vessel cerebellar infarctions. The cerebral hemispheres show mild chronic small-vessel changes within the deep white matter. 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 No mass lesion, hemorrhage, hydrocephalus or extra-axial collection. No pituitary mass. No significant sinus disease.  IMPRESSION: No acute infarction.  Chronic small vessel changes throughout the brain as outlined above.   Electronically Signed   By: Paulina Fusi M.D.   On: 02/20/2013 17:03    EKG Interpretation    Date/Time:  Thursday February 20 2013 12:42:41 EST Ventricular Rate:  60 PR Interval:  144 QRS Duration: 86 QT Interval:  386 QTC Calculation: 386 R Axis:   44 Text Interpretation:  Normal sinus rhythm Possible Left atrial enlargement Septal infarct , age undetermined No significant change since last tracing Confirmed by [facility]  MD, Zaki Gertsch [phone]) on 02/20/2013 1:18:16 PM            MDM   1. Vertigo    I doubt any other EMC precluding discharge at this time including, but not necessarily limited to the following:SAH, CVA.    Hurman Horn, MD 02/20/13 2141

## 2013-06-17 ENCOUNTER — Encounter: Payer: Self-pay | Admitting: Neurology

## 2013-06-17 ENCOUNTER — Encounter: Payer: Self-pay | Admitting: *Deleted

## 2013-06-19 ENCOUNTER — Encounter: Payer: Self-pay | Admitting: Neurology

## 2013-06-19 ENCOUNTER — Encounter (INDEPENDENT_AMBULATORY_CARE_PROVIDER_SITE_OTHER): Payer: Self-pay

## 2013-06-19 ENCOUNTER — Ambulatory Visit (INDEPENDENT_AMBULATORY_CARE_PROVIDER_SITE_OTHER): Payer: Medicare HMO | Admitting: Neurology

## 2013-06-19 VITALS — BP 135/87 | HR 59 | Ht 59.0 in | Wt 110.0 lb

## 2013-06-19 DIAGNOSIS — R413 Other amnesia: Secondary | ICD-10-CM

## 2013-06-19 DIAGNOSIS — R269 Unspecified abnormalities of gait and mobility: Secondary | ICD-10-CM

## 2013-06-19 HISTORY — DX: Unspecified abnormalities of gait and mobility: R26.9

## 2013-06-19 HISTORY — DX: Other amnesia: R41.3

## 2013-06-19 NOTE — Patient Instructions (Signed)

## 2013-06-19 NOTE — Progress Notes (Signed)
Reason for visit: Memory disturbance  Julia BordersMargaret S Patel is a 73 y.o. female  History of present illness:  Julia Patel is a 73 year old right-handed black female with a history of a progressive memory disturbance that she claims has been present only for about 4 or 5 months. The patient indicates that over the last 2 months, she has also lost weight, up to 15 pounds. She also indicates that she has had some issues over the last several weeks with some gait instability, and the family confirms that she has had a change in her walking, slowness with movement, and gait instability. The patient has had difficulty remembering to take her medications, and she has essentially stopped all of her medications. The patient was on medications for blood pressure and dyslipidemia. She will misplace things about the house frequently. The patient indicates that she does not do her finances, and the bills are paid directly from her bank account. She reports that she is sleeping well. She denies any focal numbness or weakness of the extremities, and she denies problems controlling the bowels or the bladder. She denies headache or dizziness. She indicates that she does drive a motor vehicle, and she has not had any issues with directions or safety while driving. She also indicates that she does some cooking without difficulty. She is sent to this office for further evaluation. Blood work that includes a CBC, copper and a metabolic profile, vitamin B12 level, and thyroid profile have been already checked, and the family is under the impression that these studies were unremarkable.  Past Medical History  Diagnosis Date  . Hypertension   . Hyperlipemia   . DVT (deep venous thrombosis)     rt  . Arthritis   . Migraine   . Anxiety   . Memory difficulties 06/19/2013  . Abnormality of gait 06/19/2013  . Varicose veins     legs    Past Surgical History  Procedure Laterality Date  . Hernia repair      [facility]  . Cataract extraction w/phaco  08/28/2011    Procedure: CATARACT EXTRACTION PHACO AND INTRAOCULAR LENS PLACEMENT (IOC);  Surgeon: Gemma PayorKerry Hunt, MD;  Location: [facility];  Service: Ophthalmology;  Laterality: Right;  CDE:14.42  . Cholecystectomy    . Neck surgery      Family History  Problem Relation Age of Onset  . Dementia Mother   . Dementia Father   . Breast cancer Sister     Social history:  reports that she has been smoking Cigarettes.  She has a 16 pack-year smoking history. She has never used smokeless tobacco. She reports that she does not drink alcohol or use illicit drugs.  Medications:  Current Outpatient Prescriptions on File Prior to Visit  Medication Sig Dispense Refill  . aspirin 81 MG tablet Take 81 mg by mouth daily.      Marland Kitchen. atorvastatin (LIPITOR) 10 MG tablet Take 10 mg by mouth daily.      . cholecalciferol (VITAMIN D) 1000 UNITS tablet Take 1,000 Units by mouth daily.      . diazepam (VALIUM) 5 MG tablet Take 1 tablet (5 mg total) by mouth every 6 (six) hours as needed (vertigo).  10 tablet  0  . fluticasone (FLONASE) 50 MCG/ACT nasal spray Place 1 spray into both nostrils at bedtime and may repeat dose one time if needed.      . meclizine (ANTIVERT) 25 MG tablet Take 1 tablet (25 mg total) by mouth every  6 (six) hours as needed for dizziness.  20 tablet  0  . nabumetone (RELAFEN) 500 MG tablet Take 1,000 mg by mouth daily.      . polyethylene glycol (MIRALAX / GLYCOLAX) packet Take 17 g by mouth daily.      . potassium chloride (K-DUR) 10 MEQ tablet Take 10 mEq by mouth daily.      . vitamin E (VITAMIN E) 400 UNIT capsule Take 400 Units by mouth daily.       No current facility-administered medications on file prior to visit.     No Known Allergies  ROS:  Out of a complete 14 system review of symptoms, the patient complains only of the following symptoms, and all other reviewed systems are negative.  Chills, weight loss, fatigue Dizziness Moles Easy  bruising Feeling cold Joint pain, achy muscles Allergies Memory loss, confusion, headache, numbness, weakness, dizziness, passing out, tremor Anxiety, decreased energy, change in appetite, disinterest in activities, hallucinations  Blood pressure 135/87, pulse 59, height 4\' 11"  (1.499 m), weight 110 lb (49.896 kg).  Physical Exam  General: The patient is alert and cooperative at the time of the examination.  Eyes: Pupils are equal, round, and reactive to light. Discs are flat bilaterally.  Neck: The neck is supple, no carotid bruits are noted.  Respiratory: The respiratory examination is clear.  Cardiovascular: The cardiovascular examination reveals a regular rate and rhythm, no obvious murmurs or rubs are noted.  Skin: Extremities are without significant edema. Varicose veins are present on both legs, right greater than left.  Neurologic Exam  Mental status: The Mini-Mental status examination done today shows a total score of 19/30.  Cranial nerves: Facial symmetry is present. There is good sensation of the face to pinprick and soft touch bilaterally. The strength of the facial muscles and the muscles to head turning and shoulder shrug are normal bilaterally. Speech is well enunciated, no aphasia or dysarthria is noted. Extraocular movements are full. Visual fields are full. The tongue is midline, and the patient has symmetric elevation of the soft palate. No obvious hearing deficits are noted.  Motor: The motor testing reveals 5 over 5 strength of all 4 extremities. Good symmetric motor tone is noted throughout.  Sensory: Sensory testing reveals a decrease in pinprick sensation on the left arm and leg relative to the right. Vibration sensation is decreased on the left arm and leg relative to the right. Position sensation is decreased on the left arm and both feet. No evidence of extinction is noted.  Coordination: Cerebellar testing reveals good finger-nose-finger and heel-to-shin  bilaterally.  Gait and station: Gait is notable for slightly wide-based gait. Tandem gait is unsteady.. Romberg is negative. No drift is seen.  Reflexes: Deep tendon reflexes are slightly elevated on the left biceps and left knee jerk reflex as compared to the right. The ankle jerk reflexes are depressed bilaterally. Toes are downgoing bilaterally.   Assessment/Plan:  One. Memory disturbance  2. Gait disturbance  3. Weight loss  4. Fatigue  The patient has had a progressive memory disturbance over the last several months, but this has also been associated with a change in her ability to ambulate, and associated with weight loss. The patient will be sent for MRI evaluation of the brain, and further blood work will be done today. The patient is a smoker. She will followup in 3-4 months. We will contact her when the results of the testing is available.  Marlan Palau. Keith Willis MD 06/19/2013 9:23 PM  [facility] [address] [place], [place] [address]  Phone [phone] Fax [phone]

## 2013-06-21 LAB — ANGIOTENSIN CONVERTING ENZYME: ANGIO CONVERT ENZYME: 26 U/L (ref 14–82)

## 2013-06-21 LAB — ANA W/REFLEX: Anti Nuclear Antibody(ANA): NEGATIVE

## 2013-06-21 LAB — COPPER, SERUM: Copper: 143 ug/dL (ref 72–166)

## 2013-06-21 LAB — RPR: RPR: NONREACTIVE

## 2013-06-21 LAB — THYROGLOBULIN ANTIBODY

## 2013-06-21 LAB — SEDIMENTATION RATE: Sed Rate: 3 mm/hr (ref 0–40)

## 2013-06-21 LAB — THYROID PEROXIDASE ANTIBODY: THYROID PEROXIDASE AB: 10 [IU]/mL (ref 0–34)

## 2013-06-23 NOTE — Progress Notes (Signed)
Quick Note:  Called to share lab results with patient thru VM message ______

## 2013-07-01 ENCOUNTER — Ambulatory Visit
Admission: RE | Admit: 2013-07-01 | Discharge: 2013-07-01 | Disposition: A | Discharge status: Home / Self Care | Payer: Medicare HMO | Source: Ambulatory Visit | Attending: Neurology | Admitting: Neurology

## 2013-07-01 ENCOUNTER — Telehealth: Payer: Self-pay | Admitting: Neurology

## 2013-07-01 DIAGNOSIS — R269 Unspecified abnormalities of gait and mobility: Secondary | ICD-10-CM

## 2013-07-01 DIAGNOSIS — R413 Other amnesia: Secondary | ICD-10-CM

## 2013-07-01 NOTE — Telephone Encounter (Signed)
I called patient. The MRI the brain shows no change from the prior study done in January 2015. The patient has had blood work done that was unremarkable. Etiology of a rapid onset of weight loss and memory issues and confusion is not clear. I suppose this could represent a severe depressive episode, but we would consider lumbar puncture and spinal fluid analysis at this point. If they are amenable to this, they are to call so that we can get this set up for her.   MRI brain results May 12th 2015:  Impression   Abnormal MRI brain (without) demonstrating: 1. Mild periventricular and subcortical foci of non-specific white matter  T2 hyperintensities. Small foci noted in the left cerebellar and right  pontine regions. Consistent with chronic small vessel ischemic disease. 2. Mild diffuse and mesial temporal atrophy.  3. No change from MRI on 02/20/13.

## 2013-08-23 ENCOUNTER — Emergency Department (HOSPITAL_COMMUNITY): Payer: Medicare HMO

## 2013-08-23 ENCOUNTER — Inpatient Hospital Stay (HOSPITAL_COMMUNITY)
Admission: EM | Admit: 2013-08-23 | Discharge: 2013-08-24 | DRG: 069 | Disposition: A | Discharge status: Home Health | Payer: Medicare HMO | Attending: Family Medicine | Admitting: Family Medicine

## 2013-08-23 ENCOUNTER — Encounter (HOSPITAL_COMMUNITY): Payer: Self-pay | Admitting: Emergency Medicine

## 2013-08-23 DIAGNOSIS — F411 Generalized anxiety disorder: Secondary | ICD-10-CM | POA: Diagnosis present

## 2013-08-23 DIAGNOSIS — E785 Hyperlipidemia, unspecified: Secondary | ICD-10-CM | POA: Diagnosis present

## 2013-08-23 DIAGNOSIS — I639 Cerebral infarction, unspecified: Secondary | ICD-10-CM | POA: Diagnosis present

## 2013-08-23 DIAGNOSIS — Z23 Encounter for immunization: Secondary | ICD-10-CM

## 2013-08-23 DIAGNOSIS — Z86718 Personal history of other venous thrombosis and embolism: Secondary | ICD-10-CM

## 2013-08-23 DIAGNOSIS — G459 Transient cerebral ischemic attack, unspecified: Principal | ICD-10-CM

## 2013-08-23 DIAGNOSIS — M129 Arthropathy, unspecified: Secondary | ICD-10-CM | POA: Diagnosis present

## 2013-08-23 DIAGNOSIS — G451 Carotid artery syndrome (hemispheric): Secondary | ICD-10-CM

## 2013-08-23 DIAGNOSIS — G458 Other transient cerebral ischemic attacks and related syndromes: Secondary | ICD-10-CM

## 2013-08-23 DIAGNOSIS — Z961 Presence of intraocular lens: Secondary | ICD-10-CM

## 2013-08-23 DIAGNOSIS — W19XXXA Unspecified fall, initial encounter: Secondary | ICD-10-CM | POA: Diagnosis present

## 2013-08-23 DIAGNOSIS — I739 Peripheral vascular disease, unspecified: Secondary | ICD-10-CM | POA: Diagnosis present

## 2013-08-23 DIAGNOSIS — Z7982 Long term (current) use of aspirin: Secondary | ICD-10-CM

## 2013-08-23 DIAGNOSIS — Z9849 Cataract extraction status, unspecified eye: Secondary | ICD-10-CM

## 2013-08-23 DIAGNOSIS — R269 Unspecified abnormalities of gait and mobility: Secondary | ICD-10-CM | POA: Diagnosis present

## 2013-08-23 DIAGNOSIS — Z8673 Personal history of transient ischemic attack (TIA), and cerebral infarction without residual deficits: Secondary | ICD-10-CM

## 2013-08-23 DIAGNOSIS — R413 Other amnesia: Secondary | ICD-10-CM | POA: Diagnosis present

## 2013-08-23 DIAGNOSIS — I8390 Asymptomatic varicose veins of unspecified lower extremity: Secondary | ICD-10-CM | POA: Diagnosis present

## 2013-08-23 DIAGNOSIS — F17200 Nicotine dependence, unspecified, uncomplicated: Secondary | ICD-10-CM | POA: Diagnosis present

## 2013-08-23 DIAGNOSIS — Z66 Do not resuscitate: Secondary | ICD-10-CM | POA: Diagnosis present

## 2013-08-23 DIAGNOSIS — I10 Essential (primary) hypertension: Secondary | ICD-10-CM | POA: Diagnosis present

## 2013-08-23 DIAGNOSIS — I498 Other specified cardiac arrhythmias: Secondary | ICD-10-CM | POA: Diagnosis present

## 2013-08-23 LAB — CBC WITH DIFFERENTIAL/PLATELET
BASOS PCT: 0 % (ref 0–1)
Basophils Absolute: 0 10*3/uL (ref 0.0–0.1)
EOS ABS: 0.3 10*3/uL (ref 0.0–0.7)
EOS PCT: 6 % — AB (ref 0–5)
HCT: 43.2 % (ref 36.0–46.0)
Hemoglobin: 14.1 g/dL (ref 12.0–15.0)
LYMPHS PCT: 31 % (ref 12–46)
Lymphs Abs: 1.6 10*3/uL (ref 0.7–4.0)
MCH: 29.6 pg (ref 26.0–34.0)
MCHC: 32.6 g/dL (ref 30.0–36.0)
MCV: 90.8 fL (ref 78.0–100.0)
Monocytes Absolute: 0.4 10*3/uL (ref 0.1–1.0)
Monocytes Relative: 7 % (ref 3–12)
Neutro Abs: 3 10*3/uL (ref 1.7–7.7)
Neutrophils Relative %: 56 % (ref 43–77)
PLATELETS: 210 10*3/uL (ref 150–400)
RBC: 4.76 MIL/uL (ref 3.87–5.11)
RDW: 14.3 % (ref 11.5–15.5)
WBC: 5.3 10*3/uL (ref 4.0–10.5)

## 2013-08-23 LAB — COMPREHENSIVE METABOLIC PANEL
ALT: 13 U/L (ref 0–35)
AST: 13 U/L (ref 0–37)
Albumin: 3.7 g/dL (ref 3.5–5.2)
Alkaline Phosphatase: 77 U/L (ref 39–117)
Anion gap: 12 (ref 5–15)
BUN: 13 mg/dL (ref 6–23)
CALCIUM: 9.4 mg/dL (ref 8.4–10.5)
CO2: 28 mEq/L (ref 19–32)
CREATININE: 0.64 mg/dL (ref 0.50–1.10)
Chloride: 103 mEq/L (ref 96–112)
GFR calc Af Amer: >90 mL/min (ref >90)
GFR calc non Af Amer: 88 mL/min — ABNORMAL LOW (ref >90)
Glucose, Bld: 85 mg/dL (ref 70–99)
Potassium: 4.1 mEq/L (ref 3.7–5.3)
SODIUM: 143 meq/L (ref 137–147)
TOTAL PROTEIN: 7.2 g/dL (ref 6.0–8.3)
Total Bilirubin: 0.4 mg/dL (ref 0.3–1.2)

## 2013-08-23 LAB — URINALYSIS, ROUTINE W REFLEX MICROSCOPIC
Bilirubin Urine: NEGATIVE
Glucose, UA: NEGATIVE mg/dL
Hgb urine dipstick: NEGATIVE
Ketones, ur: NEGATIVE mg/dL
Nitrite: NEGATIVE
PROTEIN: NEGATIVE mg/dL
SPECIFIC GRAVITY, URINE: 1.023 (ref 1.005–1.030)
Urobilinogen, UA: 1 mg/dL (ref 0.0–1.0)
pH: 6 (ref 5.0–8.0)

## 2013-08-23 LAB — URINE MICROSCOPIC-ADD ON

## 2013-08-23 MED ORDER — ASPIRIN 81 MG PO CHEW
81.0000 mg | CHEWABLE_TABLET | Freq: Every day | ORAL | Status: DC
  Administered 2013-08-24: 81 mg via ORAL
  Filled 2013-08-23: qty 1

## 2013-08-23 MED ORDER — PNEUMOCOCCAL VAC POLYVALENT 25 MCG/0.5ML IJ INJ
0.5000 mL | INJECTION | INTRAMUSCULAR | Status: AC
  Administered 2013-08-24: 0.5 mL via INTRAMUSCULAR
  Filled 2013-08-23: qty 0.5

## 2013-08-23 MED ORDER — STROKE: EARLY STAGES OF RECOVERY BOOK
Freq: Once | Status: DC
  Filled 2013-08-23: qty 1

## 2013-08-23 MED ORDER — CLOPIDOGREL BISULFATE 75 MG PO TABS
75.0000 mg | ORAL_TABLET | Freq: Every day | ORAL | Status: DC
  Administered 2013-08-24: 75 mg via ORAL
  Filled 2013-08-23: qty 1

## 2013-08-23 MED ORDER — HEPARIN SODIUM (PORCINE) 5000 UNIT/ML IJ SOLN
5000.0000 [IU] | Freq: Three times a day (TID) | INTRAMUSCULAR | Status: DC
  Administered 2013-08-23 – 2013-08-24 (×3): 5000 [IU] via SUBCUTANEOUS
  Filled 2013-08-23 (×3): qty 1

## 2013-08-23 MED ORDER — ATORVASTATIN CALCIUM 40 MG PO TABS: 40.0000 mg | ORAL_TABLET | Freq: Every day | ORAL | Status: DC

## 2013-08-23 MED ORDER — SENNOSIDES-DOCUSATE SODIUM 8.6-50 MG PO TABS: 1.0000 | ORAL_TABLET | Freq: Every evening | ORAL | Status: DC | PRN

## 2013-08-23 MED ORDER — MECLIZINE HCL 12.5 MG PO TABS: 25.0000 mg | ORAL_TABLET | Freq: Four times a day (QID) | ORAL | Status: DC | PRN

## 2013-08-23 NOTE — H&P (Signed)
Call Pager [phone] for any questions or notifications regarding this patient  [facility]MTS Attending Admission Note: Denny LevySara Mertie Haslem MD Attending pager:[phone] I  have seen and examined this patient, reviewed their chart. I have discussed this patient with the resident. I agree with the resident's findings, assessment and care plan. Her symptoms were present when she woke from sleep--sometime about 5 or 6 AM.Wr have consulted neurology as her symptoms are waxing and waning. She is adamant she wishes DNR. I spoke with her and her daughter at bedside.

## 2013-08-23 NOTE — ED Notes (Signed)
Julia Patel [phone] (granddaughter)

## 2013-08-23 NOTE — ED Notes (Signed)
Md Zammit at bedside.  

## 2013-08-23 NOTE — ED Notes (Signed)
Md Ermalinda MemosBradshaw made aware that pt is having increased left sided weakness.

## 2013-08-23 NOTE — ED Notes (Signed)
Pt has progressively worsening left arm and left leg weakness. Pt speech remains slurred. Pt is AO x 4, at baseline. Follows all commands. Pt facial droop to left side also noted to be worse. No drooling noted.

## 2013-08-23 NOTE — Progress Notes (Signed)
Received report from ED RN on pt coming to [id number].

## 2013-08-23 NOTE — H&P (Signed)
[facility] Admission History and Physical Service Pager: [phone]  Patient name: Julia Patel Medical record number: [id number] Date of birth: Jun 16, 1940 Age: 73 y.o. Gender: female  Primary Care Provider: Josue HectorNYLAND,LEONARD ROBERT, MD Consultants: none Code Status: DNR - per pt discussion today  Chief Complaint: L sided weakness and fall  Assessment and Plan: Julia Patel is a 73 y.o. female presenting with fall from L sided weakness suspicious for stroke . PMH is significant for HLD, vit d defficiency.   L sided weakness, fall - likely stroke - Most likely stroke given symptoms and onset this morning. Also would consider progressive dementia or neoplasia. - Admit to neurology telemetry - CT head this morning with small vessel ischemic changes but no acute intracranial abnormality. - Risk factors include smoking, untreated hypertension (controlled today) - Full stroke workup including MRI/MRA of head, carotid Doppler, and TTE. Blood work to include A1c, lipid panel - Patient on 10 mg of Lipitor and aspirin daily - Increase to 40 mg Lipitor, start Plavix and DC aspirin - Aren't stroke swallow screen, PT/OT/SLP evaluations  Abnormal UA - UA today with large leuks and many squams - No symptoms of UTI - Likely asymptomatic bacteriuria versus not clean catch  HLD - On 10 mg atorvastatin OP, increase to 40 as above  HTN - per Hx, pt dennies - Controlled without meds currently, monitor  Anxiety - pt denies and states she doesn't take valium as med list indicates  Memory loss and gait abnormalities - seen by Neuro on 06/19/2013 - MR on 5/12 with chronic small vessel disease, temporal atrophy - Followup from MRI neurology recommended lumbar puncture to further evaluate weight loss, gait abnormality, and memory loss. - Lumbar puncture likely not necessary while inpatient, recommend outpatient neuro followup.  History of DVT - Heparin prophylaxis  as below  FEN/GI: NPO until passed swallow screen then heart healthy diet, no IVF Prophylaxis: Heparin  Disposition: Neuro tele for work up and close monitoring  History of Present Illness: Julia Patel is a 73 y.o. female presenting with fall and left-sided weakness that started this morning at 6 AM (approximately 8 hours ago). She states that she's had vertigo for the last 2-3 months and progressive memory loss over the last 5-6 months. Today she feels she has left-sided weakness which has gradually gotten worse in her left upper extremity throughout the day. Her granddaughter and grandson are in the room and stated that she has slightly slurred speech compared to usual. She also endorses altered sensation in her left upper extremity. She denies confusion. She also denies abdominal pain, dysuria, or back pain.  She describes that this morning when she woke up she stood attended to take a step and fell to her left side. She states that she was dizzy at that time, which is not significantly different and has been over the last few months. She's had some mild improvement in her dizziness with meclizine.  Review of systems she denies fever, chills, sweats, confusion, chest pain, dyspnea, abdominal pain, dysuria, or other discomfort.   Review Of Systems: Per HPI , Otherwise 12 point review of systems was performed and was unremarkable.  Patient Active Problem List   Diagnosis Date Noted  . Memory difficulties 06/19/2013  . Abnormality of gait 06/19/2013   Past Medical History: Past Medical History  Diagnosis Date  . Hypertension   . Hyperlipemia   . DVT (deep venous thrombosis)     rt  .  Arthritis   . Migraine   . Anxiety   . Memory difficulties 06/19/2013  . Abnormality of gait 06/19/2013  . Varicose veins     legs   Past Surgical History: Past Surgical History  Procedure Laterality Date  . Hernia repair      [facility]  . Cataract extraction w/phaco  08/28/2011     Procedure: CATARACT EXTRACTION PHACO AND INTRAOCULAR LENS PLACEMENT (IOC);  Surgeon: Gemma Payor, MD;  Location: [facility];  Service: Ophthalmology;  Laterality: Right;  CDE:14.42  . Cholecystectomy    . Neck surgery     Social History: History  Substance Use Topics  . Smoking status: Current Every Day Smoker -- 0.40 packs/day for 40 years    Types: Cigarettes  . Smokeless tobacco: Never Used  . Alcohol Use: No   Additional social history: Please also refer to relevant sections of EMR. Current smoker, 5-6 cigarettes daily. Recently stopped for 10 years but smoked approximately 1/2 pack per day for 40-50 years prior to that.  Family History: Family History  Problem Relation Age of Onset  . Dementia Mother   . Dementia Father   . Breast cancer Sister    Allergies and Medications: No Known Allergies No current facility-administered medications on file prior to encounter.   Current Outpatient Prescriptions on File Prior to Encounter  Medication Sig Dispense Refill  . aspirin 81 MG tablet Take 81 mg by mouth daily.      Marland Kitchen atorvastatin (LIPITOR) 10 MG tablet Take 10 mg by mouth daily.      . cholecalciferol (VITAMIN D) 1000 UNITS tablet Take 1,000 Units by mouth daily.      . diazepam (VALIUM) 5 MG tablet Take 1 tablet (5 mg total) by mouth every 6 (six) hours as needed (vertigo).  10 tablet  0  . fluticasone (FLONASE) 50 MCG/ACT nasal spray Place 1 spray into both nostrils at bedtime and may repeat dose one time if needed.      . meclizine (ANTIVERT) 25 MG tablet Take 1 tablet (25 mg total) by mouth every 6 (six) hours as needed for dizziness.  20 tablet  0  . nabumetone (RELAFEN) 500 MG tablet Take 1,000 mg by mouth daily.      . polyethylene glycol (MIRALAX / GLYCOLAX) packet Take 17 g by mouth daily.      . potassium chloride (K-DUR) 10 MEQ tablet Take 10 mEq by mouth daily.      . vitamin E (VITAMIN E) 400 UNIT capsule Take 400 Units by mouth daily.        Objective: BP 106/56   Pulse 58  Temp(Src) 98.1 F (36.7 C) (Oral)  Resp 16  SpO2 100% Exam: Gen: NAD, alert, cooperative with exam HEENT: NCAT, EOMI, PERRL, EOMI CV: RRR, good S1/S2, no murmur Resp: CTABL, no wheezes, non-labored Abd: SNTND, BS present, no guarding or organomegaly Ext: No edema, warm Neuro: Alert and oriented X person, place, time, and situation, strength 4/5 on right upper and lower extremity, strength 3/5 on the left upper and lower extremity, cranial nerve II through XII intact, finger to nose slow without overshoot bilaterally, Babinski downgoing on right and upgoing on the left, no pronator drift   Labs and Imaging: CBC BMET   Recent Labs Lab 08/23/13 1135  WBC 5.3  HGB 14.1  HCT 43.2  PLT 210    Recent Labs Lab 08/23/13 1135  NA 143  K 4.1  CL 103  CO2 28  BUN 13  CREATININE 0.64  GLUCOSE 85  CALCIUM 9.4     UA: Cloudy, large leukocytes, many squames, few bacteria  CT head 08/23/2013 IMPRESSION:  No acute intracranial abnormality. Small vessel white matter  ischemic changes  Sinus disease within the ethmoid air cells.  DG chest, 08/23/2013 IMPRESSION:  No active disease.  08/23/2013: Sinus bradycardia, probable left atrial enlargement, no heart block  Elenora GammaSamuel L Jackelyn Illingworth, MD 08/23/2013, 1:13 PM PGY-3, [facility] Intern pager: [phone], text pages welcome

## 2013-08-23 NOTE — ED Provider Notes (Addendum)
CSN: [id number]     Arrival date & time 08/23/13  1059 History   First MD Initiated Contact with Patient 08/23/13 1109     Chief Complaint  Patient presents with  . Fall  . Stroke Symptoms     (Consider location/radiation/quality/duration/timing/severity/associated sxs/prior Treatment) Patient is a 73 y.o. female presenting with fall. The history is provided by a relative (pt awoke this am and was un able to walk due to weakness in left leg.  ).  Fall This is a new (pt fell this am when she got up to try to walk when she awoke.  pt complains of weakness in left leg) problem. The current episode started 1 to 2 hours ago. The problem occurs rarely. The problem has not changed since onset.Pertinent negatives include no chest pain, no abdominal pain and no headaches. Nothing aggravates the symptoms. Nothing relieves the symptoms. She has tried nothing for the symptoms.    Past Medical History  Diagnosis Date  . Hypertension   . Hyperlipemia   . DVT (deep venous thrombosis)     rt  . Arthritis   . Migraine   . Anxiety   . Memory difficulties 06/19/2013  . Abnormality of gait 06/19/2013  . Varicose veins     legs   Past Surgical History  Procedure Laterality Date  . Hernia repair      [facility]  . Cataract extraction w/phaco  08/28/2011    Procedure: CATARACT EXTRACTION PHACO AND INTRAOCULAR LENS PLACEMENT (IOC);  Surgeon: Gemma Payor, MD;  Location: [facility];  Service: Ophthalmology;  Laterality: Right;  CDE:14.42  . Cholecystectomy    . Neck surgery     Family History  Problem Relation Age of Onset  . Dementia Mother   . Dementia Father   . Breast cancer Sister    History  Substance Use Topics  . Smoking status: Current Every Day Smoker -- 0.40 packs/day for 40 years    Types: Cigarettes  . Smokeless tobacco: Never Used  . Alcohol Use: No   OB History   Grav Para Term Preterm Abortions TAB SAB Ect Mult Living                 Review of Systems  Constitutional:  Negative for appetite change and fatigue.  HENT: Negative for congestion, ear discharge and sinus pressure.   Eyes: Negative for discharge.  Respiratory: Negative for cough.   Cardiovascular: Negative for chest pain.  Gastrointestinal: Negative for abdominal pain and diarrhea.  Genitourinary: Negative for frequency and hematuria.  Musculoskeletal: Negative for back pain.       Weakness left arm and leg  Skin: Negative for rash.  Neurological: Negative for seizures and headaches.       Weakness left arm and leg  Psychiatric/Behavioral: Negative for hallucinations.      Allergies  Review of patient's allergies indicates no known allergies.  Home Medications   Prior to Admission medications   Medication Sig Start Date End Date Taking? Authorizing Provider  aspirin 81 MG tablet Take 81 mg by mouth daily.    Historical Provider, MD  atorvastatin (LIPITOR) 10 MG tablet Take 10 mg by mouth daily.    Historical Provider, MD  cholecalciferol (VITAMIN D) 1000 UNITS tablet Take 1,000 Units by mouth daily.    Historical Provider, MD  diazepam (VALIUM) 5 MG tablet Take 1 tablet (5 mg total) by mouth every 6 (six) hours as needed (vertigo). 02/20/13   Hurman Horn, MD  fluticasone (  FLONASE) 50 MCG/ACT nasal spray Place 1 spray into both nostrils at bedtime and may repeat dose one time if needed.    Historical Provider, MD  meclizine (ANTIVERT) 25 MG tablet Take 1 tablet (25 mg total) by mouth every 6 (six) hours as needed for dizziness. 02/20/13   Hurman HornJohn M Bednar, MD  nabumetone (RELAFEN) 500 MG tablet Take 1,000 mg by mouth daily.    Historical Provider, MD  polyethylene glycol (MIRALAX / GLYCOLAX) packet Take 17 g by mouth daily.    Historical Provider, MD  potassium chloride (K-DUR) 10 MEQ tablet Take 10 mEq by mouth daily.    Historical Provider, MD  vitamin E (VITAMIN E) 400 UNIT capsule Take 400 Units by mouth daily.    Historical Provider, MD   BP 117/61  Pulse 52  Temp(Src) 98.1 F (36.7  C) (Oral)  Resp 15  SpO2 100% Physical Exam  Constitutional: She is oriented to person, place, and time. She appears well-developed.  HENT:  Head: Normocephalic.  Eyes: Conjunctivae and EOM are normal. No scleral icterus.  Neck: Neck supple. No thyromegaly present.  Cardiovascular: Normal rate and regular rhythm.  Exam reveals no gallop and no friction rub.   No murmur heard. Pulmonary/Chest: No stridor. She has no wheezes. She has no rales. She exhibits no tenderness.  Abdominal: She exhibits no distension. There is no tenderness. There is no rebound.  Musculoskeletal: Normal range of motion. She exhibits no edema.  Lymphadenopathy:    She has no cervical adenopathy.  Neurological: She is oriented to person, place, and time.  Moderate weakness left arm and leg  Skin: No rash noted. No erythema.  Psychiatric: She has a normal mood and affect. Her behavior is normal.    ED Course  Procedures (including critical care time) Labs Review Labs Reviewed  CBC WITH DIFFERENTIAL - Abnormal; Notable for the following:    Eosinophils Relative 6 (*)    All other components within normal limits  COMPREHENSIVE METABOLIC PANEL - Abnormal; Notable for the following:    GFR calc non Af Amer 88 (*)    All other components within normal limits  URINALYSIS, ROUTINE W REFLEX MICROSCOPIC - Abnormal; Notable for the following:    APPearance CLOUDY (*)    Leukocytes, UA LARGE (*)    All other components within normal limits  URINE MICROSCOPIC-ADD ON - Abnormal; Notable for the following:    Squamous Epithelial / LPF MANY (*)    Bacteria, UA FEW (*)    All other components within normal limits    Imaging Review Ct Head Wo Contrast  08/23/2013   CLINICAL DATA:  stroke  EXAM: CT HEAD WITHOUT CONTRAST  TECHNIQUE: Contiguous axial images were obtained from the base of the skull through the vertex without intravenous contrast.  COMPARISON:  Brain MRI 07/01/2013.  FINDINGS: No acute intracranial  abnormality. Specifically, no hemorrhage, hydrocephalus, mass lesion, acute infarction, or significant intracranial injury. No acute calvarial abnormality. Mild areas low-attenuation within the subcortical, deep, and periventricular white matter regions consistent small vessel white matter ischemia. Areas of opacification within the ethmoid air cells. Remaining paranasal sinuses and mastoid air cells are patent.  IMPRESSION: No acute intracranial abnormality. Small vessel white matter ischemic changes  Sinus disease within the ethmoid air cells.   Electronically Signed   By: Salome HolmesHector  Cooper M.D.   On: 08/23/2013 12:16   Dg Chest Portable 1 View  08/23/2013   CLINICAL DATA:  Right-sided chest pain  EXAM: PORTABLE CHEST -  1 VIEW  COMPARISON:  None  FINDINGS: Elevation of the right diaphragm. There is no focal parenchymal opacity, pleural effusion, or pneumothorax. The heart and mediastinal contours are unremarkable.  The osseous structures are unremarkable.  IMPRESSION: No active disease.   Electronically Signed   By: Elige KoHetal  Patel   On: 08/23/2013 11:51     EKG Interpretation   Date/Time:  Saturday August 23 2013 11:13:35 EDT Ventricular Rate:  51 PR Interval:  166 QRS Duration: 89 QT Interval:  433 QTC Calculation: 399 R Axis:   48 Text Interpretation:  Age not entered, assumed to be  73 years old for  purpose of ECG interpretation Sinus rhythm Probable left atrial  enlargement Confirmed by Maesyn Frisinger  MD, Konstance Happel [phone]([id number]) on 08/23/2013 1:07:55 PM      MDM   Final diagnoses:  None    Admit for cva    Benny LennertJoseph L Neils [place], MD 08/23/13 1308  Benny LennertJoseph L Lainy Wrobleski, MD 09/23/13 [phone]

## 2013-08-23 NOTE — ED Notes (Signed)
Admitting MD at bedside.

## 2013-08-23 NOTE — Consult Note (Signed)
Referring Physician: Dr Jennette KettleNeal    Chief Complaint: Fall associated with left-sided weakness and slurred speech  HPI: Julia BordersMargaret S Patel is a 73 y.o. female with a history of hypertension, hyperlipidemia, ongoing tobacco use, anxiety, and possible early dementia who now lives with her daughter. The patient was down stairs on the couch this morning while her daughter was upstairs. The patient's daughter came down stairs at approximately 10:30 AM and found her mother on the floor in front of the couch. The patient's daughter reports that the patient initially had slurred speech, left-sided weakness, mild confusion, and looked as if she might pass out. EMS was summoned and [facility]. It is not known how long the patient was on the floor prior to being found by her daughter. The patient estimates it was an hour or 2. The daughter reports that the patient's symptoms have gradually improved since admission. Initially the patient was unable to raise her left arm.   The patient has had intermittent vertigo symptoms for at least a year. She saw Dr. Lesia SagoKeith Willis in the office on 06/19/2013 for evaluation of progressive memory disturbance, weight loss, and gait instability. An MRI was performed that showed small vessel disease. The patient had been on aspirin 81 mg daily; however, it does not sound like she was taking it on a regular basis.  Date last known well: Date: 08/23/2013 Time last known well: Unable to determine tPA Given: No: Late presentation, unable to determine time of onset, and symptoms appear to be improving.  Past Medical History  Diagnosis Date  . Hypertension   . Hyperlipemia   . DVT (deep venous thrombosis)     rt  . Arthritis   . Migraine   . Anxiety   . Memory difficulties 06/19/2013  . Abnormality of gait 06/19/2013  . Varicose veins     legs    Past Surgical History  Procedure Laterality Date  . Hernia repair      [facility]  .  Cataract extraction w/phaco  08/28/2011    Procedure: CATARACT EXTRACTION PHACO AND INTRAOCULAR LENS PLACEMENT (IOC);  Surgeon: Gemma PayorKerry Hunt, MD;  Location: [facility];  Service: Ophthalmology;  Laterality: Right;  CDE:14.42  . Cholecystectomy    . Neck surgery      Family History  Problem Relation Age of Onset  . Dementia Mother   . Dementia Father   . Breast cancer Sister    The patient denies any family history of strokes.  Social History:  reports that she has been smoking Cigarettes.  She has a 16 pack-year smoking history. She has never used smokeless tobacco. She reports that she does not drink alcohol or use illicit drugs.  Allergies: No Known Allergies  Medications:  Scheduled: .  stroke: mapping our early stages of recovery book   Does not apply Once  . atorvastatin  40 mg Oral q1800  . clopidogrel  75 mg Oral Daily  . heparin  5,000 Units Subcutaneous 3 times per day    ROS: History obtained from the patient and her daughter  General ROS: negative for - chills, fatigue, fever, night sweats, weight gain or weight loss. Positive for memory problems with occasional confusion. A history of weight loss of uncertain etiology. Vertigo. The patient chokes frequently on fluids. Psychological ROS: negative for - behavioral disorder, hallucinations, memory difficulties, mood swings or suicidal ideation Ophthalmic ROS: negative for - blurry vision, double vision, eye pain or loss  of vision. Positive for floaters ENT ROS: negative for - epistaxis, nasal discharge, oral lesions, sore throat, tinnitus or vertigo Allergy and Immunology ROS: negative for - hives or itchy/watery eyes Hematological and Lymphatic ROS: negative for - bleeding problems, bruising or swollen lymph nodes Endocrine ROS: negative for - galactorrhea, hair pattern changes, polydipsia/polyuria or temperature intolerance Respiratory ROS: negative for - cough, hemoptysis, shortness of breath or wheezing Cardiovascular ROS:  negative for - chest pain, dyspnea on exertion, edema or irregular heartbeat Gastrointestinal ROS: negative for - abdominal pain, diarrhea, hematemesis, nausea/vomiting or stool incontinence Genito-Urinary ROS: negative for - dysuria, hematuria, incontinence or urinary frequency/urgency Musculoskeletal ROS: negative for - joint swelling or muscular weakness. Positive for mild arthritis. Neurological ROS: as noted in HPI Dermatological ROS: negative for rash and skin lesion changes   Physical Examination: Blood pressure 113/74, pulse 55, temperature 98.9 F (37.2 C), temperature source Oral, resp. rate 12, SpO2 100.00%.  Neurologic Examination: Mental Status: Alert, oriented to person and place but not year. She knew it was July 4. Thought content appropriate.  Able to identify objects. Able to do simple calculations. Speech fluent without evidence of aphasia.  Able to follow 3 step commands without difficulty. Cranial Nerves: II: Discs not visualized; Visual fields grossly normal, pupils equal, round, reactive to light and accommodation III,IV, VI: ptosis not present, extra-ocular motions intact bilaterally V,VII: Very mild left lower facial weakness, facial light touch sensation normal bilaterally VIII: hearing normal bilaterally IX,X: gag reflex present XI: bilateral shoulder shrug XII: midline tongue extension Motor: Right : Upper extremity   5/5    Left:     Upper extremity   5/5  Lower extremity   5/5     Lower extremity   5/5 Tone and bulk:normal tone throughout; no atrophy noted Sensory: Pinprick and light touch intact throughout, bilaterally Deep Tendon Reflexes: 2+ in both upper extremities. Absent in the lower extremities Plantars: Right: downgoing   Left: downgoing Cerebellar: normal finger-to-nose bilaterally. Gait: Deferred   Laboratory Studies:  Basic Metabolic Panel:  Recent Labs Lab 08/23/13 1135  NA 143  K 4.1  CL 103  CO2 28  GLUCOSE 85  BUN 13   CREATININE 0.64  CALCIUM 9.4    Liver Function Tests:  Recent Labs Lab 08/23/13 1135  AST 13  ALT 13  ALKPHOS 77  BILITOT 0.4  PROT 7.2  ALBUMIN 3.7   No results found for this basename: LIPASE, AMYLASE,  in the last 168 hours No results found for this basename: AMMONIA,  in the last 168 hours  CBC:  Recent Labs Lab 08/23/13 1135  WBC 5.3  NEUTROABS 3.0  HGB 14.1  HCT 43.2  MCV 90.8  PLT 210    Cardiac Enzymes: No results found for this basename: CKTOTAL, CKMB, CKMBINDEX, TROPONINI,  in the last 168 hours  BNP: No components found with this basename: POCBNP,   CBG: No results found for this basename: GLUCAP,  in the last 168 hours  Microbiology: No results found for this or any previous visit.  Coagulation Studies: No results found for this basename: LABPROT, INR,  in the last 72 hours  Urinalysis:  Recent Labs Lab 08/23/13 1144  COLORURINE YELLOW  LABSPEC 1.023  PHURINE 6.0  GLUCOSEU NEGATIVE  HGBUR NEGATIVE  BILIRUBINUR NEGATIVE  KETONESUR NEGATIVE  PROTEINUR NEGATIVE  UROBILINOGEN 1.0  NITRITE NEGATIVE  LEUKOCYTESUR LARGE*    Lipid Panel: No results found for this basename: chol, trig, hdl, cholhdl, vldl, ldlcalc  HgbA1C:  No results found for this basename: HGBA1C    Urine Drug Screen:   No results found for this basename: labopia, cocainscrnur, labbenz, amphetmu, thcu, labbarb    Alcohol Level: No results found for this basename: ETH,  in the last 168 hours  Other results: EKG: SR rate 51 BPM. Please refer to the formal reading for complete details.  Imaging:  Ct Head Wo Contrast 08/23/2013    No acute intracranial abnormality. Small vessel white matter ischemic changes  Sinus disease within the ethmoid air cells.     Dg Chest Portable 1 View 08/23/2013    No active disease.     Delton See PA-C [facility] Pager [phone] 08/23/2013, 4:58 PM  Patient seen and examined.  Clinical course and  management discussed.  Necessary edits performed.  I agree with the above.  Assessment and plan of care developed and discussed below.   Assessment: 73 y.o. female admitted after being found on the floor of her daughter's home. The patient believes that she got dizzy and fell but could not get up. It is not known how long the patient was down. The patient's daughter witnessed left sided weakness and speech difficulties after the fall which have since improved. CT of the head was reviewed and shows no acute changes. Patient is to be on an aspirin a day at home but does not take this consistently.  Further work up recommended  Stroke Risk Factors - hyperlipidemia, hypertension and smoking  Plan: 1. HgbA1c, fasting lipid panel 2. MRI, MRA  of the brain without contrast 3. PT consult, OT consult, Speech consult 4. Echocardiogram 5. Carotid dopplers 6. Prophylactic therapy-Antiplatelet med: Aspirin - dose 325 mg daily 7. Risk factor modification 8. Telemetry monitoring 9. Frequent neuro checks   Thana Farr, MD [facility] [phone]  08/23/2013  5:35 PM

## 2013-08-23 NOTE — Progress Notes (Signed)
Dr. Jennette KettleNeal at bedside. MD requested that pt remain NPO at this time. Repeat Swallow Screen this evening; if pt passes at that time, can advance diet.

## 2013-08-23 NOTE — ED Notes (Signed)
Per EMS: Pt from home. Per family, pt was found on floor this morning on ground. Pt denies any pain or complaint. Pt noted to have slurred speech and left sided weakness. LSN last night at 1159. Pt is alert, oriented at baseline. 112/57. CBG 75.

## 2013-08-23 NOTE — Progress Notes (Signed)
Pt in room. Pt stable.

## 2013-08-24 ENCOUNTER — Inpatient Hospital Stay (HOSPITAL_COMMUNITY): Payer: Medicare HMO

## 2013-08-24 DIAGNOSIS — I369 Nonrheumatic tricuspid valve disorder, unspecified: Secondary | ICD-10-CM

## 2013-08-24 LAB — LIPID PANEL
CHOLESTEROL: 143 mg/dL (ref 0–200)
HDL: 59 mg/dL (ref >39)
LDL Cholesterol: 76 mg/dL (ref 0–99)
TRIGLYCERIDES: 39 mg/dL (ref <150)
Total CHOL/HDL Ratio: 2.4 RATIO
VLDL: 8 mg/dL (ref 0–40)

## 2013-08-24 LAB — HEMOGLOBIN A1C
HEMOGLOBIN A1C: 5.7 % — AB (ref <5.7)
Mean Plasma Glucose: 117 mg/dL — ABNORMAL HIGH (ref <117)

## 2013-08-24 LAB — GLUCOSE, CAPILLARY: Glucose-Capillary: 101 mg/dL — ABNORMAL HIGH (ref 70–99)

## 2013-08-24 MED ORDER — ATORVASTATIN CALCIUM 40 MG PO TABS: 40.0000 mg | ORAL_TABLET | Freq: Every day | ORAL | Status: DC

## 2013-08-24 NOTE — Progress Notes (Signed)
  Echocardiogram 2D Echocardiogram has been performed.  Julia Patel, Julia Patel 08/24/2013, 2:10 PM

## 2013-08-24 NOTE — Discharge Summary (Signed)
Family Medicine Teaching [facility] Discharge Summary  Patient name: Julia Patel Grego Medical record number: [id number] Date of birth: [birth year] Age: 73 y.o. Gender: female Date of Admission: 08/23/2013  Date of Discharge: 08/24/2013  Admitting Physician: Nestor RampSara L Neal, MD  Primary Care Provider: Josue HectorNYLAND,LEONARD ROBERT, MD Consultants: Neurology  Indication for Hospitalization: L sided wekaness  Discharge Diagnoses/Problem List:  TIA HLD Memory loss Appetite loss  Disposition: Home  Discharge Condition: Stable  Discharge Exam: Gen: NAD, alert, cooperative with exam  HEENT: NCAT, MMM  CV: RRR, good S1/S2, no murmur  Resp: CTABL, no wheezes, non-labored  Abd: SNTND, BS present, no guarding or organomegaly  Ext: No edema, warm  Neuro: Alert and oriented X 3, Strength 5/5 on BL upper andlower extremities, Babinski downgoing BL LE, Finger to nose NL BL, CN 2-12 WNL   Brief Hospital Course:  Julia Patel Julia Patel is a 73 y.o. female presenting with fall from L sided weakness suspicious for stroke. Her symptoms initially worsened with exagerations of her LUE wekaness and dysarthria and then imprved before 24 hours of onset making TIA more likely. She was advised to take her ASA daily, we increased her lipitor to 40 mg daily, and she had a stroke work up including MRI brain, CT head, carotid doppler, and TTE which were without acute findings and detailed below.   She was evaluated by PT/OT/SLP who recommended: Continued PT service for gait abnormality and generalized weakness, with tailored treatment of DME instruction, gait training, stair training, Functional mobility training, Therapeutic activities, Therapeutic exercise, Balance training, Neuromuscular re-education, and Patient/family education.  She has Hx of HTN which was well controlled throughout her stay without meds. Her family complained of appetite loss. We recommend considering remeron for this and depression but will defer to  PCP.   She had + leuks with squams on a UA without symptoms of UTI so it was considered to be asymptomatic bacteuria vs un-clean catch.   Issues for Follow Up:  1. Continue with high index of suspicion for stroke  2. Consider remeron for appetite stim and depression.   Significant Procedures: None  Significant Labs and Imaging:   Recent Labs Lab 08/23/13 1135  WBC 5.3  HGB 14.1  HCT 43.2  PLT 210    Recent Labs Lab 08/23/13 1135  NA 143  K 4.1  CL 103  CO2 28  GLUCOSE 85  BUN 13  CREATININE 0.64  CALCIUM 9.4  ALKPHOS 77  AST 13  ALT 13  ALBUMIN 3.7     08/24/2013 04:49  Cholesterol 143  Triglycerides 39  HDL 59  LDL (calc) 76  VLDL 8  Total CHOL/HDL Ratio 2.4    CT head 08/23/2013  IMPRESSION:  No acute intracranial abnormality. Small vessel white matter  ischemic changes  Sinus disease within the ethmoid air cells.   DG chest, 08/23/2013  IMPRESSION:  No active disease.  MRI brain 08/24/2013 IMPRESSION:  Question subtle changes of acute/ subacute infarction in the right  side of the pons. There is old infarction in this region, and the  findings could be due to T2 shine through.  Old small vessel ischemic changes affecting the pons, cerebellum and  cerebral hemispheric white matter.  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    Results/Tests Pending at Time of Discharge: None  Discharge Medications:    Medication List  aspirin 81 MG tablet  Take 81 mg by mouth daily.     atorvastatin 40 MG tablet  Commonly known as:  LIPITOR  Take 1 tablet (40 mg total) by mouth daily at 6 PM.     cholecalciferol 1000 UNITS tablet  Commonly known as:  VITAMIN D  Take 1,000 Units by mouth daily.        Discharge Instructions: Please refer to Patient Instructions section of EMR for full details.  Patient was counseled important signs and symptoms that  should prompt return to medical care, changes in medications, dietary instructions, activity restrictions, and follow up appointments.   Follow-Up Appointments: Follow-up Information   Follow up with Josue HectorNYLAND,LEONARD ROBERT, MD. Call in 1 day. (call for appt within 1 week, will need to discuss need of carotid doppler)    Specialty:  Family Medicine   Contact information:   [address] [place] [id number] [phone]       Kathee DeltonIan D Riona Lahti, MD 08/24/2013, 4:47 PM PGY-1, [facility]

## 2013-08-24 NOTE — Progress Notes (Signed)
Discharged to home with family office visits in place teaching done  

## 2013-08-24 NOTE — Discharge Instructions (Signed)
Transient Ischemic Attack  A transient ischemic attack (TIA) is a "warning stroke" that causes stroke-like symptoms. Unlike a stroke, a TIA does not cause permanent damage to the brain. The symptoms of a TIA can happen very fast and do not last long. It is important to know the symptoms of a TIA and what to do. This can help prevent a major stroke or death.  CAUSES    A TIA is caused by a temporary blockage in an artery in the brain or neck (carotid artery). The blockage does not allow the brain to get the blood supply it needs and can cause different symptoms. The blockage can be caused by either:   A blood clot.   Fatty buildup (plaque) in a neck or brain artery.  RISK FACTORS   High blood pressure (hypertension).   High cholesterol.   Diabetes mellitus.   Heart disease.   The build up of plaque in the blood vessels (peripheral artery disease or atherosclerosis).   The build up of plaque in the blood vessels providing blood and oxygen to the brain (carotid artery stenosis).   An abnormal heart rhythm (atrial fibrillation).   Obesity.   Smoking.   Taking oral contraceptives (especially in combination with smoking).   Physical inactivity.   A diet high in fats, salt (sodium), and calories.   Alcohol use.   Use of illegal drugs (especially cocaine and methamphetamine).   Being female.   Being African American.   Being over the age of 55.   Family history of stroke.   Previous history of blood clots, stroke, TIA, or heart attack.   Sickle cell disease.  SYMPTOMS   TIA symptoms are the same as a stroke but are temporary. These symptoms usually develop suddenly, or may be newly present upon awakening from sleep:   Sudden weakness or numbness of the face, arm, or leg, especially on one side of the body.   Sudden trouble walking or difficulty moving arms or legs.   Sudden confusion.   Sudden personality changes.   Trouble speaking (aphasia) or understanding.   Difficulty swallowing.   Sudden  trouble seeing in one or both eyes.   Double vision.   Dizziness.   Loss of balance or coordination.   Sudden severe headache with no known cause.   Trouble reading or writing.   Loss of bowel or bladder control.   Loss of consciousness.  DIAGNOSIS   Your caregiver may be able to determine the presence or absence of a TIA based on your symptoms, history, and physical exam. Computed tomography (CT scan) of the brain is usually performed to help identify a TIA. Other tests may be done to diagnose a TIA. These tests may include:   Electrocardiography.   Continuous heart monitoring.   Echocardiography.   Carotid ultrasonography.   Magnetic resonance imaging (MRI).   A scan of the brain circulation.   Blood tests.  PREVENTION   The risk of a TIA can be decreased by appropriately treating high blood pressure, high cholesterol, diabetes, heart disease, and obesity and by quitting smoking, limiting alcohol, and staying physically active.  TREATMENT   Time is of the essence. Since the symptoms of TIA are the same as a stroke, it is important to seek treatment as soon as possible because you may need a medicine to dissolve the clot (thrombolytic) that cannot be given if too much time has passed. Treatment options vary. Treatment options may include rest, oxygen, intravenous (IV) fluids,   and medicines to thin the blood (anticoagulants). Medicines and diet may be used to address diabetes, high blood pressure, and other risk factors. Measures will be taken to prevent short-term and long-term complications, including infection from breathing foreign material into the lungs (aspiration pneumonia), blood clots in the legs, and falls. Treatment options include procedures to either remove plaque in the carotid arteries or dilate carotid arteries that have narrowed due to plaque. Those procedures are:   Carotid endarterectomy.   Carotid angioplasty and stenting.  HOME CARE INSTRUCTIONS    Take all medicines prescribed  by your caregiver. Follow the directions carefully. Medicines may be used to control risk factors for a stroke. Be sure you understand all your medicine instructions.   You may be told to take aspirin or the anticoagulant warfarin. Warfarin needs to be taken exactly as instructed.   Taking too much or too little warfarin is dangerous. Too much warfarin increases the risk of bleeding. Too little warfarin continues to allow the risk for blood clots. While taking warfarin, you will need to have regular blood tests to measure your blood clotting time. A PT blood test measures how long it takes for blood to clot. Your PT is used to calculate another value called an INR. Your PT and INR help your caregiver to adjust your dose of warfarin. The dose can change for many reasons. It is critically important that you take warfarin exactly as prescribed.   Many foods, especially foods high in vitamin K can interfere with warfarin and affect the PT and INR. Foods high in vitamin K include spinach, kale, broccoli, cabbage, collard and turnip greens, brussels sprouts, peas, cauliflower, seaweed, and parsley as well as beef and pork liver, green tea, and soybean oil. You should eat a consistent amount of foods high in vitamin K. Avoid major changes in your diet, or notify your caregiver before changing your diet. Arrange a visit with a dietitian to answer your questions.   Many medicines can interfere with warfarin and affect the PT and INR. You must tell your caregiver about any and all medicines you take, this includes all vitamins and supplements. Be especially cautious with aspirin and anti-inflammatory medicines. Do not take or discontinue any prescribed or over-the-counter medicine except on the advice of your caregiver or pharmacist.   Warfarin can have side effects, such as excessive bruising or bleeding. You will need to hold pressure over cuts for longer than usual. Your caregiver or pharmacist will discuss other  potential side effects.   Avoid sports or activities that may cause injury or bleeding.   Be mindful when shaving, flossing your teeth, or handling sharp objects.   Alcohol can change the body's ability to handle warfarin. It is best to avoid alcoholic drinks or consume only very small amounts while taking warfarin. Notify your caregiver if you change your alcohol intake.   Notify your dentist or other caregivers before procedures.   Eat a diet that includes 5 or more servings of fruits and vegetables each day. This may reduce the risk of stroke. Certain diets may be prescribed to address high blood pressure, high cholesterol, diabetes, or obesity.   A low-sodium, low-saturated fat, low-trans fat, low-cholesterol diet is recommended to manage high blood pressure.   A low-saturated fat, low-trans fat, low-cholesterol, and high-fiber diet may control cholesterol levels.   A controlled-carbohydrate, controlled-sugar diet is recommended to manage diabetes.   A reduced-calorie, low-sodium, low-saturated fat, low-trans fat, low-cholesterol diet is recommended to manage obesity.     Maintain a healthy weight.   Stay physically active. It is recommended that you get at least 30 minutes of activity on most or all days.   Do not smoke.   Limit alcohol use even if you are not taking warfarin. Moderate alcohol use is considered to be:   No more than 2 drinks each day for men.   No more than 1 drink each day for nonpregnant women.   Stop drug abuse.   Home safety. A safe home environment is important to reduce the risk of falls. Your caregiver may arrange for specialists to evaluate your home. Having grab bars in the bedroom and bathroom is often important. Your caregiver may arrange for equipment to be used at home, such as raised toilets and a seat for the shower.   Follow all instructions for follow-up with your caregiver. This is very important. This includes any referrals and lab tests. Proper follow up can  prevent a stroke or another TIA from occurring.  SEEK MEDICAL CARE IF:   You have personality changes.   You have difficulty swallowing.   You are seeing double.   You have dizziness.   You have a fever.   You have skin breakdown.  SEEK IMMEDIATE MEDICAL CARE IF:   Any of these symptoms may represent a serious problem that is an emergency. Do not wait to see if the symptoms will go away. Get medical help right away. Call your local emergency services (911 in [place] Do not drive yourself to the hospital.   You have sudden weakness or numbness of the face, arm, or leg, especially on one side of the body.   You have sudden trouble walking or difficulty moving arms or legs.   You have sudden confusion.   You have trouble speaking (aphasia) or understanding.   You have sudden trouble seeing in one or both eyes.   You have a loss of balance or coordination.   You have a sudden, severe headache with no known cause.   You have new chest pain or an irregular heartbeat.   You have a partial or total loss of consciousness.  MAKE SURE YOU:    Understand these instructions.   Will watch your condition.   Will get help right away if you are not doing well or get worse.  Document Released: 11/16/2004 Document Revised: 02/11/2013 Document Reviewed: 04/01/2009  ExitCare Patient Information 2015 [employer]. This information is not intended to replace advice given to you by your health care provider. Make sure you discuss any questions you have with your health care provider.

## 2013-08-24 NOTE — Evaluation (Signed)
Physical Therapy Evaluation Patient Details Name: Julia BordersMargaret S Dax MRN: [id number] DOB: Jul 24, 1940 Today's Date: 08/24/2013   History of Present Illness  73 y.o. female with a history of hypertension, hyperlipidemia, ongoing tobacco use, anxiety, and possible early dementia who now lives with her daughter. The patient was down stairs on the couch this morning while her daughter was upstairs. The patient's daughter came down stairs at approximately 10:30 AM and found her mother on the floor in front of the couch. [facility] and Lt sided weakness. An MRI was performed that showed small vessel disease.   Clinical Impression  Pt adm from home due to the above. Presents with  Mild Lt sided weakness attributing to decreased independence with functional mobility and mild balance deficits. Pt to benefit from skilled acute PT to address deficits indicated below and maximize functional mobility prior to D/C with family. Recommend use of RW upon acute D/C to maximize stability with gt. Patient needs to practice stairs next session.       Follow Up Recommendations Home health PT;Supervision/Assistance - 24 hour    Equipment Recommendations  Rolling walker with 5" wheels    Recommendations for Other Services OT consult     Precautions / Restrictions Precautions Precautions: Fall Restrictions Weight Bearing Restrictions: No      Mobility  Bed Mobility Overal bed mobility: Modified Independent             General bed mobility comments: no (A) needed  Transfers Overall transfer level: Needs assistance Equipment used: None Transfers: Sit to/from Stand Sit to Stand: Min guard         General transfer comment: min guard for initial sit to stand; pt with sway; cues for safety  Ambulation/Gait Ambulation/Gait assistance: Min guard;Min assist Ambulation Distance (Feet): 250 Feet Assistive device: None;Rolling walker (2 wheeled) Gait Pattern/deviations: Step-through  pattern;Narrow base of support;Shuffle;Decreased stride length (Lt LE slightly ER) Gait velocity: decreased Gait velocity interpretation: Below normal speed for age/gender General Gait Details: pt initially ambulating without RW and required min (A) to maintain balance; with RW pt progressed to min guard and demo incr stability; cues for proper RW mangement and safety  Stairs            Wheelchair Mobility    Modified Rankin (Stroke Patients Only) Modified Rankin (Stroke Patients Only) Pre-Morbid Rankin Score: No symptoms Modified Rankin: Moderately severe disability     Balance Overall balance assessment: Needs assistance Sitting-balance support: Feet supported;No upper extremity supported Sitting balance-Leahy Scale: Good Sitting balance - Comments: no c/o dizziness Postural control: Posterior lean;Left lateral lean Standing balance support: During functional activity;Bilateral upper extremity supported;No upper extremity supported Standing balance-Leahy Scale: Fair Standing balance comment: +sway initially with standing; was able to stand minimal amout of time without UE support                             Pertinent Vitals/Pain No c/o pain    Home Living Family/patient expects to be discharged to:: Private residence Living Arrangements: Children Available Help at Discharge: Family;Available 24 hours/day Type of Home: House Home Access: Stairs to enter Entrance Stairs-Rails: Right Entrance Stairs-Number of Steps: 3 Home Layout: One level Home Equipment: Cane - single point      Prior Function Level of Independence: Independent         Comments: family present to confrim history of PLOF     Hand Dominance  Extremity/Trunk Assessment   Upper Extremity Assessment: Defer to OT evaluation           Lower Extremity Assessment: LLE deficits/detail   LLE Deficits / Details: Lt hip 4-/5; knee 4/5; ankle 4/5  Cervical / Trunk  Assessment: Kyphotic  Communication   Communication: No difficulties  Cognition Arousal/Alertness: Awake/alert Behavior During Therapy: WFL for tasks assessed/performed Overall Cognitive Status: History of cognitive impairments - at baseline       Memory: Decreased short-term memory              General Comments General comments (skin integrity, edema, etc.): educated pt and family on recommendation for use of RW; both agreeable and understanding     Exercises        Assessment/Plan    PT Assessment Patient needs continued PT services  PT Diagnosis Abnormality of gait;Generalized weakness   PT Problem List Decreased strength;Decreased activity tolerance;Decreased balance;Decreased mobility;Decreased knowledge of use of DME  PT Treatment Interventions DME instruction;Gait training;Stair training;Functional mobility training;Therapeutic activities;Therapeutic exercise;Balance training;Neuromuscular re-education;Patient/family education   PT Goals (Current goals can be found in the Care Plan section) Acute Rehab PT Goals Patient Stated Goal: to go home with my family PT Goal Formulation: With patient Time For Goal Achievement: 08/28/13 Potential to Achieve Goals: Good    Frequency Min 4X/week   Barriers to discharge        Co-evaluation               End of Session Equipment Utilized During Treatment: Gait belt Activity Tolerance: Patient tolerated treatment well Patient left: in bed;with call bell/phone within reach;with bed alarm set;with family/visitor present Nurse Communication: Mobility status         Time: [id number] PT Time Calculation (min): 20 min   Charges:   PT Evaluation $Initial PT Evaluation Tier I: 1 Procedure PT Treatments $Gait Training: 8-22 mins   PT G CodesDonell Sievert:          Braydan Marriott N, [place]PT  [phone] 08/24/2013, 2:16 PM

## 2013-08-24 NOTE — Progress Notes (Signed)
CALL PAGER [phone] for any questions or notifications regarding this patient   FMTS Attending Daily Note: Denny LevySara Emile Ringgenberg MD  Attending [phone]  office [phone]  I  have seen and examined this patient, reviewed their chart. I have discussed this patient with the resident. I agree with the resident's findings, assessment and care plan. Outstanding resolution of what seems like all of her symptoms. Will complete stroke risk work up (awaiting imaging studies--may be tomorrow AM before we can get those) and she is [facility] with waiting for that. Appreciate neuro recs. Will d/c home on Aspirin--evidentlly she was NOT taking it daily so she  is not truly an "aspirin failure" TIA/stroke. I dicussed with her how important this is and she expresses understanding.

## 2013-08-24 NOTE — Progress Notes (Signed)
Family Medicine Teaching Service Daily Progress Note Intern Pager: [phone]  Patient name: Julia Patel Medical record number: [id number] Date of birth: 1940-08-22 Age: 73 y.o. Gender: female  Primary Care Provider: Josue HectorNYLAND,LEONARD ROBERT, MD Consultants: Neurology Code Status: DNR  Pt Overview and Major Events to Date:  08/23/2013- admitted with L sided weakness 08/24/2013: Weakness improved to resolved  Assessment and Plan: Julia BordersMargaret S Mirelez is a 73 y.o. female presenting with fall from L sided weakness suspicious for stroke . PMH is significant for HLD, vit d defficiency.   L sided weakness, fall - likely TIA vs stroke  - Most likely TIA given symptoms resolution. Also would consider progressive dementia or neoplasia.  - CT head at admission with small vessel ischemic changes but no acute intracranial abnormality.  - Risk factors include smoking, untreated hypertension (controlled today)  - Full stroke workup including MRI/MRA of head, carotid Doppler, and TTE. Blood work to include A1c, lipid panel  - Patient on 10 mg of Lipitor and aspirin daily  - Increase to 40 mg Lipitor, start Plavix and DC aspirin  - RN stroke swallow screen passed, PT/OT/SLP evaluations pending  Abnormal UA - asymptomatic bacturia - UA today with large leuks and many squams  - No symptoms of UTI   HLD  - On 10 mg atorvastatin OP, increase to 40 as above   HTN  - per Hx, pt dennies  - Controlled without meds currently, monitor   Anxiety  - pt denies and states she doesn't take valium as med list indicates   Memory loss and gait abnormalities  - seen by Neuro on 06/19/2013  - MR on 5/12 with chronic small vessel disease, temporal atrophy  - Followup from MRI neurology recommended lumbar puncture to further evaluate weight loss, gait abnormality, and memory loss.  - Lumbar puncture likely not necessary while inpatient, recommend outpatient neuro followup.   History of DVT  - Heparin prophylaxis as  below   FEN/GI: NPO until passed swallow screen then heart healthy diet, no IVF  Prophylaxis: Heparin   Disposition: Home when work up and evaluations complete  Subjective:  Feeling better, feels weakness is resolved.   Objective: Temp:  [98.1 F (36.7 C)-99.3 F (37.4 C)] 98.4 F (36.9 C) (07/05 0100) Pulse Rate:  [52-69] 62 (07/05 0100) Resp:  [12-20] 16 (07/05 0100) BP: (106-123)/(48-76) 111/60 mmHg (07/05 0100) SpO2:  [97 %-100 %] 99 % (07/05 0100) Weight:  [116 lb 13.5 oz (53 kg)] 116 lb 13.5 oz (53 kg) (07/04 2000) Physical Exam: Gen: NAD, alert, cooperative with exam HEENT: NCAT, MMM CV: RRR, good S1/S2, no murmur Resp: CTABL, no wheezes, non-labored Abd: SNTND, BS present, no guarding or organomegaly Ext: No edema, warm Neuro: Alert and oriented X 4, Strength 5/5 on BL upper andlower extremities, Babinski downgoing BL LE, Finger to nose NL BL, CN 2-12 WNL   Laboratory:  Recent Labs Lab 08/23/13 1135  WBC 5.3  HGB 14.1  HCT 43.2  PLT 210    Recent Labs Lab 08/23/13 1135  NA 143  K 4.1  CL 103  CO2 28  BUN 13  CREATININE 0.64  CALCIUM 9.4  PROT 7.2  BILITOT 0.4  ALKPHOS 77  ALT 13  AST 13  GLUCOSE 85   T Chol 143, LDL 76   Imaging/Diagnostic Tests: CT head 08/23/2013  IMPRESSION:  No acute intracranial abnormality. Small vessel white matter  ischemic changes  Sinus disease within the ethmoid air cells.   DG  chest, 08/23/2013  IMPRESSION:  No active disease.   Elenora GammaSamuel L Bradshaw, MD 08/24/2013, 9:10 AM PGY-3, [place] FPTS Intern pager: [phone], text pages welcome

## 2013-08-27 NOTE — Discharge Summary (Signed)
.  Family Medicine Teaching Service  Discharge Note : Attending Sara Neal MD Pager [phone] Office [phone] I have seen and examined this patient, reviewed their chart and discussed discharge planning with the resident at the time of discharge. I agree with the discharge plan as above.  

## 2013-10-20 ENCOUNTER — Telehealth: Payer: Self-pay | Admitting: Adult Health

## 2013-10-20 ENCOUNTER — Ambulatory Visit: Payer: Medicare HMO | Admitting: Adult Health

## 2013-10-20 NOTE — Telephone Encounter (Signed)
This patient no showed for a revisit appointment. 

## 2013-11-06 ENCOUNTER — Other Ambulatory Visit (HOSPITAL_COMMUNITY): Payer: Self-pay | Admitting: Family Medicine

## 2013-12-19 ENCOUNTER — Other Ambulatory Visit (HOSPITAL_COMMUNITY): Payer: Self-pay | Admitting: Family Medicine

## 2014-01-07 ENCOUNTER — Encounter: Payer: Self-pay | Admitting: Neurology

## 2014-01-13 ENCOUNTER — Encounter: Payer: Self-pay | Admitting: Neurology

## 2014-01-30 ENCOUNTER — Other Ambulatory Visit: Payer: Self-pay | Admitting: Family Medicine

## 2014-02-19 ENCOUNTER — Other Ambulatory Visit: Payer: Self-pay | Admitting: Family Medicine

## 2017-05-03 ENCOUNTER — Emergency Department (HOSPITAL_COMMUNITY): Payer: Medicare HMO

## 2017-05-03 ENCOUNTER — Inpatient Hospital Stay (HOSPITAL_COMMUNITY)
Admission: EM | Admit: 2017-05-03 | Discharge: 2017-05-07 | DRG: 176 | Disposition: A | Discharge status: Home Health | Payer: Medicare HMO | Attending: Internal Medicine | Admitting: Internal Medicine

## 2017-05-03 ENCOUNTER — Other Ambulatory Visit: Payer: Self-pay

## 2017-05-03 ENCOUNTER — Encounter (HOSPITAL_COMMUNITY): Payer: Self-pay | Admitting: Emergency Medicine

## 2017-05-03 DIAGNOSIS — Z7982 Long term (current) use of aspirin: Secondary | ICD-10-CM

## 2017-05-03 DIAGNOSIS — Z961 Presence of intraocular lens: Secondary | ICD-10-CM | POA: Diagnosis present

## 2017-05-03 DIAGNOSIS — F419 Anxiety disorder, unspecified: Secondary | ICD-10-CM | POA: Diagnosis present

## 2017-05-03 DIAGNOSIS — Z79899 Other long term (current) drug therapy: Secondary | ICD-10-CM

## 2017-05-03 DIAGNOSIS — Z8673 Personal history of transient ischemic attack (TIA), and cerebral infarction without residual deficits: Secondary | ICD-10-CM

## 2017-05-03 DIAGNOSIS — M545 Low back pain: Secondary | ICD-10-CM

## 2017-05-03 DIAGNOSIS — Z9049 Acquired absence of other specified parts of digestive tract: Secondary | ICD-10-CM

## 2017-05-03 DIAGNOSIS — I10 Essential (primary) hypertension: Secondary | ICD-10-CM | POA: Diagnosis present

## 2017-05-03 DIAGNOSIS — E785 Hyperlipidemia, unspecified: Secondary | ICD-10-CM | POA: Diagnosis present

## 2017-05-03 DIAGNOSIS — I8390 Asymptomatic varicose veins of unspecified lower extremity: Secondary | ICD-10-CM | POA: Diagnosis present

## 2017-05-03 DIAGNOSIS — I2699 Other pulmonary embolism without acute cor pulmonale: Secondary | ICD-10-CM | POA: Diagnosis not present

## 2017-05-03 DIAGNOSIS — R1084 Generalized abdominal pain: Secondary | ICD-10-CM | POA: Diagnosis not present

## 2017-05-03 DIAGNOSIS — K4090 Unilateral inguinal hernia, without obstruction or gangrene, not specified as recurrent: Secondary | ICD-10-CM | POA: Diagnosis present

## 2017-05-03 DIAGNOSIS — Z87891 Personal history of nicotine dependence: Secondary | ICD-10-CM

## 2017-05-03 DIAGNOSIS — F0390 Unspecified dementia without behavioral disturbance: Secondary | ICD-10-CM

## 2017-05-03 DIAGNOSIS — R7302 Impaired glucose tolerance (oral): Secondary | ICD-10-CM

## 2017-05-03 DIAGNOSIS — Z86718 Personal history of other venous thrombosis and embolism: Secondary | ICD-10-CM

## 2017-05-03 LAB — CBC WITH DIFFERENTIAL/PLATELET
BASOS PCT: 0 %
Basophils Absolute: 0 10*3/uL (ref 0.0–0.1)
EOS PCT: 2 %
Eosinophils Absolute: 0.2 10*3/uL (ref 0.0–0.7)
HEMATOCRIT: 44.9 % (ref 36.0–46.0)
Hemoglobin: 14.5 g/dL (ref 12.0–15.0)
Lymphocytes Relative: 19 %
Lymphs Abs: 1.5 10*3/uL (ref 0.7–4.0)
MCH: 28.5 pg (ref 26.0–34.0)
MCHC: 32.3 g/dL (ref 30.0–36.0)
MCV: 88.2 fL (ref 78.0–100.0)
MONOS PCT: 6 %
Monocytes Absolute: 0.5 10*3/uL (ref 0.1–1.0)
Neutro Abs: 5.6 10*3/uL (ref 1.7–7.7)
Neutrophils Relative %: 73 %
Platelets: 202 10*3/uL (ref 150–400)
RBC: 5.09 MIL/uL (ref 3.87–5.11)
RDW: 15 % (ref 11.5–15.5)
WBC: 7.8 10*3/uL (ref 4.0–10.5)

## 2017-05-03 LAB — URINALYSIS, ROUTINE W REFLEX MICROSCOPIC
BILIRUBIN URINE: NEGATIVE
Bacteria, UA: NONE SEEN
GLUCOSE, UA: NEGATIVE mg/dL
Hgb urine dipstick: NEGATIVE
KETONES UR: NEGATIVE mg/dL
NITRITE: NEGATIVE
PH: 5 (ref 5.0–8.0)
PROTEIN: NEGATIVE mg/dL
Specific Gravity, Urine: 1.023 (ref 1.005–1.030)

## 2017-05-03 MED ORDER — ONDANSETRON HCL 4 MG/2ML IJ SOLN
4.0000 mg | Freq: Once | INTRAMUSCULAR | Status: AC
  Administered 2017-05-03: 4 mg via INTRAVENOUS
  Filled 2017-05-03: qty 2

## 2017-05-03 NOTE — ED Triage Notes (Signed)
Pt c/o of back pain x 3 days.  caregiver states that pt's urine has a foul odor.

## 2017-05-04 ENCOUNTER — Inpatient Hospital Stay (HOSPITAL_COMMUNITY): Payer: Medicare HMO

## 2017-05-04 ENCOUNTER — Emergency Department (HOSPITAL_COMMUNITY): Payer: Medicare HMO

## 2017-05-04 DIAGNOSIS — E785 Hyperlipidemia, unspecified: Secondary | ICD-10-CM | POA: Diagnosis present

## 2017-05-04 DIAGNOSIS — I10 Essential (primary) hypertension: Secondary | ICD-10-CM | POA: Diagnosis present

## 2017-05-04 DIAGNOSIS — K4090 Unilateral inguinal hernia, without obstruction or gangrene, not specified as recurrent: Secondary | ICD-10-CM | POA: Diagnosis present

## 2017-05-04 DIAGNOSIS — R7302 Impaired glucose tolerance (oral): Secondary | ICD-10-CM

## 2017-05-04 DIAGNOSIS — I361 Nonrheumatic tricuspid (valve) insufficiency: Secondary | ICD-10-CM | POA: Diagnosis not present

## 2017-05-04 DIAGNOSIS — E782 Mixed hyperlipidemia: Secondary | ICD-10-CM

## 2017-05-04 DIAGNOSIS — I2609 Other pulmonary embolism with acute cor pulmonale: Secondary | ICD-10-CM | POA: Diagnosis not present

## 2017-05-04 DIAGNOSIS — I2699 Other pulmonary embolism without acute cor pulmonale: Secondary | ICD-10-CM | POA: Diagnosis present

## 2017-05-04 DIAGNOSIS — R1084 Generalized abdominal pain: Secondary | ICD-10-CM | POA: Diagnosis present

## 2017-05-04 DIAGNOSIS — Z7982 Long term (current) use of aspirin: Secondary | ICD-10-CM | POA: Diagnosis not present

## 2017-05-04 DIAGNOSIS — Z79899 Other long term (current) drug therapy: Secondary | ICD-10-CM | POA: Diagnosis not present

## 2017-05-04 DIAGNOSIS — F0390 Unspecified dementia without behavioral disturbance: Secondary | ICD-10-CM

## 2017-05-04 DIAGNOSIS — F419 Anxiety disorder, unspecified: Secondary | ICD-10-CM | POA: Diagnosis present

## 2017-05-04 DIAGNOSIS — Z961 Presence of intraocular lens: Secondary | ICD-10-CM | POA: Diagnosis present

## 2017-05-04 DIAGNOSIS — I8390 Asymptomatic varicose veins of unspecified lower extremity: Secondary | ICD-10-CM | POA: Diagnosis present

## 2017-05-04 DIAGNOSIS — Z9049 Acquired absence of other specified parts of digestive tract: Secondary | ICD-10-CM | POA: Diagnosis not present

## 2017-05-04 DIAGNOSIS — Z86718 Personal history of other venous thrombosis and embolism: Secondary | ICD-10-CM | POA: Diagnosis not present

## 2017-05-04 DIAGNOSIS — Z8673 Personal history of transient ischemic attack (TIA), and cerebral infarction without residual deficits: Secondary | ICD-10-CM | POA: Diagnosis not present

## 2017-05-04 DIAGNOSIS — Z87891 Personal history of nicotine dependence: Secondary | ICD-10-CM | POA: Diagnosis not present

## 2017-05-04 LAB — APTT: aPTT: 29 seconds (ref 24–36)

## 2017-05-04 LAB — TROPONIN I: Troponin I: <0.03 ng/mL (ref <0.03)

## 2017-05-04 LAB — COMPREHENSIVE METABOLIC PANEL
ALT: 14 U/L (ref 14–54)
AST: 19 U/L (ref 15–41)
Albumin: 4.2 g/dL (ref 3.5–5.0)
Alkaline Phosphatase: 89 U/L (ref 38–126)
Anion gap: 11 (ref 5–15)
BUN: 15 mg/dL (ref 6–20)
CHLORIDE: 103 mmol/L (ref 101–111)
CO2: 26 mmol/L (ref 22–32)
CREATININE: 0.62 mg/dL (ref 0.44–1.00)
Calcium: 9.7 mg/dL (ref 8.9–10.3)
GFR calc non Af Amer: >60 mL/min (ref >60)
Glucose, Bld: 111 mg/dL — ABNORMAL HIGH (ref 65–99)
Potassium: 4.2 mmol/L (ref 3.5–5.1)
Sodium: 140 mmol/L (ref 135–145)
Total Bilirubin: 0.7 mg/dL (ref 0.3–1.2)
Total Protein: 8.6 g/dL — ABNORMAL HIGH (ref 6.5–8.1)

## 2017-05-04 LAB — PROTIME-INR
INR: 0.97
PROTHROMBIN TIME: 12.8 s (ref 11.4–15.2)

## 2017-05-04 LAB — HEPARIN LEVEL (UNFRACTIONATED)
HEPARIN UNFRACTIONATED: 0.78 [IU]/mL — AB (ref 0.30–0.70)
Heparin Unfractionated: 0.66 IU/mL (ref 0.30–0.70)

## 2017-05-04 LAB — LIPASE, BLOOD: Lipase: 38 U/L (ref 11–51)

## 2017-05-04 MED ORDER — ONDANSETRON HCL 4 MG/2ML IJ SOLN: 4.0000 mg | Freq: Four times a day (QID) | INTRAMUSCULAR | Status: DC | PRN

## 2017-05-04 MED ORDER — ACETAMINOPHEN 650 MG RE SUPP: 650.0000 mg | Freq: Four times a day (QID) | RECTAL | Status: DC | PRN

## 2017-05-04 MED ORDER — HEPARIN BOLUS VIA INFUSION
3000.0000 [IU] | Freq: Once | INTRAVENOUS | Status: AC
  Administered 2017-05-04: 3000 [IU] via INTRAVENOUS

## 2017-05-04 MED ORDER — ACETAMINOPHEN 325 MG PO TABS
650.0000 mg | ORAL_TABLET | Freq: Four times a day (QID) | ORAL | Status: DC | PRN
Start: 2017-05-04 — End: 2017-05-07
  Administered 2017-05-04 – 2017-05-05 (×2): 650 mg via ORAL
  Filled 2017-05-04 (×2): qty 2

## 2017-05-04 MED ORDER — HEPARIN (PORCINE) IN NACL 100-0.45 UNIT/ML-% IJ SOLN
850.0000 [IU]/h | INTRAMUSCULAR | Status: AC
  Administered 2017-05-04: 900 [IU]/h via INTRAVENOUS
  Administered 2017-05-05 – 2017-05-06 (×2): 850 [IU]/h via INTRAVENOUS
  Filled 2017-05-04 (×3): qty 250

## 2017-05-04 MED ORDER — IOPAMIDOL (ISOVUE-300) INJECTION 61%
100.0000 mL | Freq: Once | INTRAVENOUS | Status: AC | PRN
  Administered 2017-05-04: 100 mL via INTRAVENOUS

## 2017-05-04 MED ORDER — SODIUM CHLORIDE 0.9 % IV SOLN
1.0000 g | INTRAVENOUS | Status: DC
  Administered 2017-05-04 – 2017-05-05 (×2): 1 g via INTRAVENOUS
  Filled 2017-05-04: qty 10
  Filled 2017-05-04: qty 1
  Filled 2017-05-04 (×2): qty 10

## 2017-05-04 MED ORDER — ONDANSETRON HCL 4 MG PO TABS: 4.0000 mg | ORAL_TABLET | Freq: Four times a day (QID) | ORAL | Status: DC | PRN

## 2017-05-04 MED ORDER — SODIUM CHLORIDE 0.9 % IV SOLN
INTRAVENOUS | Status: DC
  Administered 2017-05-04 – 2017-05-06 (×3): via INTRAVENOUS

## 2017-05-04 MED ORDER — PANTOPRAZOLE SODIUM 40 MG PO TBEC
40.0000 mg | DELAYED_RELEASE_TABLET | Freq: Two times a day (BID) | ORAL | Status: DC
  Administered 2017-05-04 – 2017-05-07 (×5): 40 mg via ORAL
  Filled 2017-05-04 (×7): qty 1

## 2017-05-04 MED ORDER — ATORVASTATIN CALCIUM 40 MG PO TABS
40.0000 mg | ORAL_TABLET | Freq: Every day | ORAL | Status: DC
  Filled 2017-05-04 (×3): qty 1

## 2017-05-04 NOTE — Progress Notes (Signed)
ANTICOAGULATION CONSULT NOTE - Preliminary  Pharmacy Consult for heparin Indication: pulmonary embolus  No Known Allergies  Patient Measurements: Height: 5' (152.4 cm) Weight: 118 lb 13.3 oz (53.9 kg) IBW/kg (Calculated) : 45.5 HEPARIN DW (KG): 53.9   Vital Signs: Temp: 98.8 F (37.1 C) (03/15 1029) Temp Source: Oral (03/15 1029) BP: 120/76 (03/15 1029) Pulse Rate: 92 (03/15 1029)  Labs: Recent Labs    05/03/17 2330 05/04/17 0533 05/04/17 1203  HGB 14.5  --   --   HCT 44.9  --   --   PLT 202  --   --   APTT 29  --   --   LABPROT 12.8  --   --   INR 0.97  --   --   HEPARINUNFRC  --   --  0.78*  CREATININE 0.62  --   --   TROPONINI <0.03 <0.03 <0.03   Estimated Creatinine Clearance: 43.6 mL/min (by C-G formula based on SCr of 0.62 mg/dL).  Medical History: Past Medical History:  Diagnosis Date  . Abnormality of gait 06/19/2013  . Anxiety   . Arthritis   . DVT (deep venous thrombosis) (HCC)    rt  . Hyperlipemia   . Hypertension   . Memory difficulties 06/19/2013  . Migraine   . Varicose veins    legs    Medications:  Scheduled:  . pantoprazole  40 mg Oral BID   Infusions:  . sodium chloride 50 mL/hr at 05/04/17 0339  . cefTRIAXone (ROCEPHIN)  IV Stopped (05/04/17 0420)  . heparin 900 Units/hr (05/04/17 0420)   PRN: acetaminophen **OR** acetaminophen, ondansetron **OR** ondansetron (ZOFRAN) IV  Assessment: 77 yo female with PMH HTN, HLD, and dementia.  Came to ED c/o abdominal pain and nausea.  CT showed bilateral PE.  Spoke with patient's daughter who confirmed she does not take any anticoagulants at home.    Goal of Therapy:  Heparin level 0.3-0.7 units/ml   Plan:  Decrease heparin infusion to 850 units/hr Check anti-Xa level in 8 hours and daily while on heparin Continue to monitor H&H and platelets  Judeth CornfieldSteven Shonika Kolasinski, PharmD Clinical Pharmacist 05/04/2017 2:34 PM

## 2017-05-04 NOTE — ED Notes (Signed)
Pt in [place]

## 2017-05-04 NOTE — ED Provider Notes (Signed)
[facility] EMERGENCY DEPARTMENT Provider Note   CSN: [id number] Arrival date & time: 05/03/17  1812     History   Chief Complaint Chief Complaint  Patient presents with  . Back Pain    HPI Julia Patel is a 77 y.o. female with past medical history of HTN,, hyperlipidemia and "memory difficulties" presenting initially with a 3 day hs of back pain per triage notes, but now with complaint of generalized abdominal pain and nausea.  She denies  vomiting, fever, dysuria, hematuria, constipation or diarrhea. She denies any changes in her appetite.  Caregiver reporting her urine has been stronger than normal.  She cannot describe any alleviators or aggravators for her symptoms.    The history is provided by the patient. The history is limited by the condition of the patient. With her dementia, she is not capable of a meaningful description of her symptoms.  Cousin at bedside endorses this is her baseline mentation.     The history is provided by the patient.    Past Medical History:  Diagnosis Date  . Abnormality of gait 06/19/2013  . Anxiety   . Arthritis   . DVT (deep venous thrombosis) (HCC)    rt  . Hyperlipemia   . Hypertension   . Memory difficulties 06/19/2013  . Migraine   . Varicose veins    legs    Patient Active Problem List   Diagnosis Date Noted  . Ischemic stroke (HCC) 08/23/2013  . TIA (transient ischemic attack) 08/23/2013  . Memory difficulties 06/19/2013  . Abnormality of gait 06/19/2013    Past Surgical History:  Procedure Laterality Date  . CATARACT EXTRACTION W/PHACO  08/28/2011   Procedure: CATARACT EXTRACTION PHACO AND INTRAOCULAR LENS PLACEMENT (IOC);  Surgeon: Gemma Payor, MD;  Location: [facility];  Service: Ophthalmology;  Laterality: Right;  CDE:14.42  . CHOLECYSTECTOMY    . HERNIA REPAIR     [facility]  . NECK SURGERY      OB History    No data available       Home Medications    Prior to Admission medications   Medication Sig  Start Date End Date Taking? Authorizing Provider  aspirin 81 MG tablet Take 81 mg by mouth daily.    [provider]  atorvastatin (LIPITOR) 40 MG tablet Take 1 tablet (40 mg total) by mouth daily at 6 PM. 08/24/13   McKeag, Janine Ores, MD  cholecalciferol (VITAMIN D) 1000 UNITS tablet Take 1,000 Units by mouth daily.    [provider]    Family History Family History  Problem Relation Age of Onset  . Dementia Mother   . Dementia Father   . Breast cancer Sister     Social History Social History   Tobacco Use  . Smoking status: Former Smoker    Packs/day: 0.40    Years: 40.00    Pack years: 16.00    Types: Cigarettes  . Smokeless tobacco: Never Used  Substance Use Topics  . Alcohol use: No  . Drug use: No     Allergies   Patient has no known allergies.   Review of Systems Review of Systems  Unable to perform ROS: Dementia  Gastrointestinal: Positive for abdominal pain and nausea. Negative for diarrhea and vomiting.  Musculoskeletal: Positive for back pain.     Physical Exam Updated Vital Signs BP (!) 151/90 (BP Location: Right Arm)   Pulse 100   Temp 97.7 F (36.5 C) (Oral)   Resp 14  SpO2 92%   Physical Exam  Constitutional: She appears well-developed and well-nourished.  HENT:  Head: Normocephalic and atraumatic.  Eyes: Conjunctivae are normal.  Neck: Normal range of motion.  Cardiovascular: Normal rate, regular rhythm, normal heart sounds and intact distal pulses.  Pulmonary/Chest: Effort normal and breath sounds normal. She has no wheezes.  Abdominal: Soft. Bowel sounds are normal. She exhibits no distension, no fluid wave and no mass. There is tenderness. There is guarding. There is no rigidity.  Generalized mild guarding. No rebound.  Musculoskeletal: Normal range of motion.  Neurological: She is alert.  Skin: Skin is warm and dry.  Psychiatric: She has a normal mood and affect.  Nursing note and vitals reviewed.    ED Treatments  / Results  Labs (all labs ordered are listed, but only abnormal results are displayed) Labs Reviewed  URINALYSIS, ROUTINE W REFLEX MICROSCOPIC - Abnormal; Notable for the following components:      Result Value   APPearance CLOUDY (*)    Leukocytes, UA LARGE (*)    Squamous Epithelial / LPF 0-5 (*)    All other components within normal limits  COMPREHENSIVE METABOLIC PANEL - Abnormal; Notable for the following components:   Glucose, Bld 111 (*)    Total Protein 8.6 (*)    All other components within normal limits  CBC WITH DIFFERENTIAL/PLATELET  LIPASE, BLOOD  TROPONIN I    EKG  EKG Interpretation  Date/Time:  Thursday May 03 2017 23:21:43 EDT Ventricular Rate:  96 PR Interval:    QRS Duration: 84 QT Interval:  324 QTC Calculation: 410 R Axis:   18 Text Interpretation:  Sinus rhythm Left atrial enlargement Abnormal R-wave progression, early transition Baseline wander When compared with ECG of 08/23/2013 No significant change was found Confirmed by Samuel JesterMcManus, Kathleen [phone]([id number]) on 05/03/2017 11:56:51 PM       Radiology Dg Chest Portable 1 View  Result Date: 05/04/2017 CLINICAL DATA:  Tachypnea EXAM: PORTABLE CHEST 1 VIEW COMPARISON:  Chest radiograph 08/23/2013 FINDINGS: Shallow lung inflation with bibasilar atelectasis. No focal consolidation. No pneumothorax or sizable pleural effusion. IMPRESSION: Shallow lung inflation with bibasilar atelectasis. Electronically Signed   By: Deatra RobinsonKevin  Herman M.D.   On: 05/04/2017 01:31    Procedures Procedures (including critical care time)  Medications Ordered in ED Medications  ondansetron (ZOFRAN) injection 4 mg (4 mg Intravenous Given 05/03/17 2338)  iopamidol (ISOVUE-300) 61 % injection 100 mL (100 mLs Intravenous Contrast Given 05/04/17 0123)     Initial Impression / Assessment and Plan / ED Course  I have reviewed the triage vital signs and the nursing notes.  Pertinent labs & imaging results that were available during my care of  the patient were reviewed by me and considered in my medical decision making (see chart for details).     During ed stay, pt started to become borderline tachycardic and tachypneic.  Denies cp, denies sob.  Portable chest ordered.  Pending labs and abdominal CT imaging.    Discussed with Dr Judd Lienelo who assumes care of patient.  Final Clinical Impressions(s) / ED Diagnoses   Final diagnoses:  Low back pain, unspecified back pain laterality, unspecified chronicity, with sciatica presence unspecified  Generalized abdominal pain    ED Discharge Orders    None       Victoriano Laindol, Truman Aceituno, PA-C 05/04/17 0135    Geoffery Lyonselo, Douglas, MD 05/04/17 [phone]

## 2017-05-04 NOTE — ED Notes (Signed)
Pt had urine accident. Pt. And bed cleaned with clean linens. Breakfast tray given

## 2017-05-04 NOTE — ED Notes (Signed)
Pt returned from CT Scan 

## 2017-05-04 NOTE — Progress Notes (Signed)
ANTICOAGULATION CONSULT NOTE - Preliminary  Pharmacy Consult for heparin Indication: pulmonary embolus  No Known Allergies  Patient Measurements: Weight: 116 lb (52.6 kg)     Vital Signs: Temp: 97.7 F (36.5 C) (03/14 1823) Temp Source: Oral (03/14 1823) BP: 134/82 (03/15 0130) Pulse Rate: 113 (03/15 0115)  Labs: Recent Labs    05/03/17 2330  HGB 14.5  HCT 44.9  PLT 202  CREATININE 0.62  TROPONINI <0.03   CrCl cannot be calculated (Unknown ideal weight.).  Medical History: Past Medical History:  Diagnosis Date  . Abnormality of gait 06/19/2013  . Anxiety   . Arthritis   . DVT (deep venous thrombosis) (HCC)    rt  . Hyperlipemia   . Hypertension   . Memory difficulties 06/19/2013  . Migraine   . Varicose veins    legs    Medications:  Scheduled:  . heparin  3,000 Units Intravenous Once   Infusions:  . sodium chloride    . cefTRIAXone (ROCEPHIN)  IV    . heparin     PRN: acetaminophen **OR** acetaminophen, ondansetron **OR** ondansetron (ZOFRAN) IV  Assessment: 77 yo female with PMH HTN, HLD, and dementia.  Came to ED c/o abdominal pain and nausea.  CT showed bilateral PE.  Spoke with patient's daughter who confirmed she does not take any anticoagulants at home.  Labs pending.   Goal of Therapy:  Heparin level 0.3-0.7 units/ml   Plan:  Give 3000 units bolus x 1 Start heparin infusion at 900 units/hr Check anti-Xa level in 8 hours and daily while on heparin Continue to monitor H&H and platelets Preliminary review of pertinent patient information completed.  Jeani HawkingAnnie Penn clinical pharmacist will complete review during morning rounds to assess the patient and finalize treatment regimen.  Diondre Pulis Scarlett, RPH 05/04/2017,2:55 AM

## 2017-05-04 NOTE — H&P (Signed)
History and Physical  Julia BordersMargaret S Mcroy ZOX:[id number] DOB: 1940/10/16 DOA: 05/03/2017   PCP: Joette CatchingNyland, Leonard, MD   Patient coming from: Home  Chief Complaint: abdominal pain  HPI:  Julia Patel is a 77 y.o. female with medical history of dementia, right lower extremity DVT, hypertension, hyperlipidemia presenting with 2-3-day history of abdominal pain.  The family initially felt this may been due to eating some unusual red meat.  Unfortunately, the patient is unable to provide any significant history secondary to her dementia.  All the history is obtained from review of the medical record and speaking with the patient's granddaughter is at the bedside.  The patient has not had any recent travels or eating undercooked or unusual foods.  There is not been any fevers, chills, chest pain, shortness breath, coughing, hemoptysis, nausea, vomiting, diarrhea, dysuria, hematuria, headaches.  The patient is usually ambulatory without any assistive devices.  There is no history of malignancy.  There is no recent travels.  CT of the abdomen and pelvis was obtained in the emergency department.  There was incidental finding of bilateral lower lobe pulmonary emboli with evidence of right heart strain.  As result, the patient was admitted for further evaluation and treatment.  The patient has not had any recent surgeries or any new changes in her medications recently. The patient's daughter subsequently arrived and stated that the patient has had a history of lower extremity DVT approximately 10 years ago.  The patient was on warfarin at that time, but it is unclear how long the patient took the warfarin.  Her daughter states that the patient has been off of all types of anticoagulation for at least 5 years. In the emergency department, the patient was afebrile hemodynamically stable saturating 100% on room air.  BMP, LFTs, and CBC were essentially unremarkable.  Urinalysis showed 6-30 WBCs.  CT of the abdomen  and pelvis showed mild stomach wall thickening.  There is herniation of small bowel short segment into the left inguinal hernia without obstruction.  There was diverticulosis.  Otherwise CT was unremarkable except for the incidental finding of pulmonary emboli.  The patient was started on heparin drip.  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 -check factor V leiden, lupus AC, prothrombin gene mutation -stable on RA  Abdominal pain -Etiology unclear, but suspect gastritis -Start PPI -Full liquid diet for now, advance as tolerated -05/04/2017 CT abdomen and pelvis short segment small bowel herniation into the left inguinal hernia without obstruction; mild stomach wall thickening; no hydronephrosis or obstructive renal stones  Hyperlipidemia -Continue statin  Dementia without behavioral disturbance -Continue Aricept  Pyuria -Continue ceftriaxone pending culture data  Impaired glucose tolerance -06/06/2016 hemoglobin A1c 5.7 -Allow for low blood glycemic control at this point          Past Medical History:  Diagnosis Date  . Abnormality of gait 06/19/2013  . Anxiety   . Arthritis   . DVT (deep venous thrombosis) (HCC)    rt  . Hyperlipemia   . Hypertension   . Memory difficulties 06/19/2013  . Migraine   . Varicose veins    legs   Past Surgical History:  Procedure Laterality Date  . CATARACT EXTRACTION W/PHACO  08/28/2011   Procedure: CATARACT EXTRACTION PHACO AND INTRAOCULAR LENS PLACEMENT (IOC);  Surgeon: Gemma PayorKerry Hunt, MD;  Location: [facility];  Service: Ophthalmology;  Laterality: Right;  CDE:14.42  .  CHOLECYSTECTOMY    . HERNIA REPAIR     [facility]  . NECK SURGERY     Social History:  reports that she has quit smoking. Her smoking use included cigarettes. She has a 16.00 pack-year smoking history. she has never used smokeless tobacco. She  reports that she does not drink alcohol or use drugs.   Family History  Problem Relation Age of Onset  . Dementia Mother   . Dementia Father   . Breast cancer Sister      No Known Allergies   Prior to Admission medications   Medication Sig Start Date End Date Taking? Authorizing Provider  aspirin 81 MG tablet Take 81 mg by mouth daily.    [provider]  atorvastatin (LIPITOR) 40 MG tablet Take 1 tablet (40 mg total) by mouth daily at 6 PM. 08/24/13   McKeag, Janine Ores, MD  cholecalciferol (VITAMIN D) 1000 UNITS tablet Take 1,000 Units by mouth daily.    [provider]    Review of Systems:  Limited secondary to the patient's cognitive impairment  Physical Exam: Vitals:   05/04/17 0400 05/04/17 0430 05/04/17 0500 05/04/17 0525  BP: 103/85 112/75 128/69 128/69  Pulse:    98  Resp: 20 (!) 26 14 16   Temp:      TempSrc:      SpO2:    98%  Weight:       General:  A&O x 1, NAD, nontoxic, pleasant/cooperative Head/Eye: No conjunctival hemorrhage, no icterus, [place]/AT, No nystagmus ENT:  No icterus,  No thrush, good dentition, no pharyngeal exudate Neck:  No masses, no lymphadenpathy, no bruits CV:  RRR, no rub, no gallop, no S3 Lung: Bibasilar rales.  No wheezing.  Good air movement. Abdomen: soft/NT, +BS, nondistended, no peritoneal signs Ext: No cyanosis, No rashes, No petechiae, No lymphangitis, No edema Neuro: CNII-XII intact, strength 4/5 in bilateral upper and lower extremities, no dysmetria  Labs on Admission:  Basic Metabolic Panel: Recent Labs  Lab 05/03/17 2330  NA 140  K 4.2  CL 103  CO2 26  GLUCOSE 111*  BUN 15  CREATININE 0.62  CALCIUM 9.7   Liver Function Tests: Recent Labs  Lab 05/03/17 2330  AST 19  ALT 14  ALKPHOS 89  BILITOT 0.7  PROT 8.6*  ALBUMIN 4.2   Recent Labs  Lab 05/03/17 2330  LIPASE 38   No results for input(s): AMMONIA in the last 168 hours. CBC: Recent Labs  Lab 05/03/17 2330  WBC 7.8  NEUTROABS 5.6    HGB 14.5  HCT 44.9  MCV 88.2  PLT 202   Coagulation Profile: Recent Labs  Lab 05/03/17 2330  INR 0.97   Cardiac Enzymes: Recent Labs  Lab 05/03/17 2330 05/04/17 0533  TROPONINI <0.03 <0.03   BNP: Invalid input(s): POCBNP CBG: No results for input(s): GLUCAP in the last 168 hours. Urine analysis:    Component Value Date/Time   COLORURINE YELLOW 05/03/2017 2045   APPEARANCEUR CLOUDY (A) 05/03/2017 2045   LABSPEC 1.023 05/03/2017 2045   PHURINE 5.0 05/03/2017 2045   GLUCOSEU NEGATIVE 05/03/2017 2045   HGBUR NEGATIVE 05/03/2017 2045   BILIRUBINUR NEGATIVE 05/03/2017 2045   KETONESUR NEGATIVE 05/03/2017 2045   PROTEINUR NEGATIVE 05/03/2017 2045   UROBILINOGEN 1.0 08/23/2013 1144   NITRITE NEGATIVE 05/03/2017 2045   LEUKOCYTESUR LARGE (A) 05/03/2017 2045   Sepsis Labs: @LABRCNTIP (procalcitonin:4,lacticidven:4) )No results found for this or any previous visit (from the past 240 hour(s)).   Radiological Exams on  Admission: Ct Abdomen Pelvis W Contrast  Result Date: 05/04/2017 CLINICAL DATA:  Acute onset of generalized abdominal pain and nausea. EXAM: CT ABDOMEN AND PELVIS WITH CONTRAST TECHNIQUE: Multidetector CT imaging of the abdomen and pelvis was performed using the standard protocol following bolus administration of intravenous contrast. CONTRAST:  ISOVUE-300 IOPAMIDOL (ISOVUE-300) INJECTION 61% COMPARISON:  None. FINDINGS: Lower chest: Pulmonary emboli are noted within the pulmonary arteries to both lower lung lobes. There is prominence of the right side of the heart, raising concern for right heart strain. Hepatobiliary: Scattered cysts within the liver measure up to 3.7 cm in size. The patient is status post cholecystectomy, with clips noted at the gallbladder fossa. The common bile duct remains normal in caliber. Pancreas: The pancreas is within normal limits. Spleen: The spleen is unremarkable in appearance. Adrenals/Urinary Tract: The adrenal glands are  unremarkable in appearance. Mild right renal atrophy and scarring is noted. Nonobstructing left renal stones measure up to 4 mm in size. There is no evidence of hydronephrosis. No obstructing ureteral stones are seen. No perinephric stranding is appreciated. Stomach/Bowel: Mild wall thickening at the stomach may reflect mild gastritis. The small bowel is within normal limits. The appendix is normal in caliber, without evidence of appendicitis. The colon is unremarkable in appearance. Vascular/Lymphatic: The stomach is unremarkable in appearance. There is herniation of a short segment of small bowel into a left inguinal hernia, without evidence for obstruction. The small bowel is otherwise unremarkable. The appendix is normal in caliber, without evidence of appendicitis. Scattered diverticulosis is noted along the descending and sigmoid colon, without evidence of diverticulitis. Reproductive: The bladder is mildly distended and grossly unremarkable. The uterus is unremarkable in appearance. No suspicious adnexal masses are seen. Other: No additional soft tissue abnormalities are seen. Musculoskeletal: No acute osseous abnormalities are identified. The visualized musculature is unremarkable in appearance. IMPRESSION: 1. Pulmonary emboli within the pulmonary arteries to both lower lung lobes. Prominence of the right side of the heart raises concern for right heart strain. 2. Mild wall thickening at the stomach may reflect mild gastritis. 3. Herniation of a short segment of small bowel into a left inguinal hernia, without evidence for obstruction. 4. Scattered hepatic cysts noted. Mild right renal atrophy and scarring. Nonobstructing left renal stones seen. 5. Scattered diverticulosis along the descending and sigmoid colon, without evidence of diverticulitis. Critical Value/emergent results were called by telephone at the time of interpretation on 05/04/2017 at 1:57 am to Dr. Judd Lien, who verbally acknowledged these  results. Electronically Signed   By: Roanna Raider M.D.   On: 05/04/2017 02:10   Dg Chest Portable 1 View  Result Date: 05/04/2017 CLINICAL DATA:  Tachypnea EXAM: PORTABLE CHEST 1 VIEW COMPARISON:  Chest radiograph 08/23/2013 FINDINGS: Shallow lung inflation with bibasilar atelectasis. No focal consolidation. No pneumothorax or sizable pleural effusion. IMPRESSION: Shallow lung inflation with bibasilar atelectasis. Electronically Signed   By: Deatra Robinson M.D.   On: 05/04/2017 01:31    EKG: Independently reviewed.  Sinus rhythm, nonspecific T wave change    Time spent:70 minutes Code Status:   FULL Family Communication:  Daughter updated at bedside Disposition Plan: expect 2-3 day hospitalization Consults called: none DVT Prophylaxis: IV heparin  Catarina Hartshorn, DO  [facility] Pager [phone]  If 7PM-7AM, please contact night-coverage [email or web address] Password [facility] 05/04/2017, 8:39 AM

## 2017-05-04 NOTE — ED Notes (Signed)
Patient transported to CT 

## 2017-05-05 ENCOUNTER — Inpatient Hospital Stay (HOSPITAL_COMMUNITY): Payer: Medicare HMO

## 2017-05-05 DIAGNOSIS — I361 Nonrheumatic tricuspid (valve) insufficiency: Secondary | ICD-10-CM

## 2017-05-05 DIAGNOSIS — I10 Essential (primary) hypertension: Secondary | ICD-10-CM

## 2017-05-05 LAB — BASIC METABOLIC PANEL
ANION GAP: 10 (ref 5–15)
BUN: 13 mg/dL (ref 6–20)
CHLORIDE: 103 mmol/L (ref 101–111)
CO2: 24 mmol/L (ref 22–32)
Calcium: 8.8 mg/dL — ABNORMAL LOW (ref 8.9–10.3)
Creatinine, Ser: 0.64 mg/dL (ref 0.44–1.00)
GFR calc Af Amer: >60 mL/min (ref >60)
GLUCOSE: 81 mg/dL (ref 65–99)
POTASSIUM: 3.6 mmol/L (ref 3.5–5.1)
Sodium: 137 mmol/L (ref 135–145)

## 2017-05-05 LAB — URINE CULTURE

## 2017-05-05 LAB — CBC
HEMATOCRIT: 38 % (ref 36.0–46.0)
HEMOGLOBIN: 11.8 g/dL — AB (ref 12.0–15.0)
MCH: 27.9 pg (ref 26.0–34.0)
MCHC: 31.1 g/dL (ref 30.0–36.0)
MCV: 89.8 fL (ref 78.0–100.0)
Platelets: 246 10*3/uL (ref 150–400)
RBC: 4.23 MIL/uL (ref 3.87–5.11)
RDW: 15.1 % (ref 11.5–15.5)
WBC: 6.5 10*3/uL (ref 4.0–10.5)

## 2017-05-05 LAB — ECHOCARDIOGRAM COMPLETE
Height: 60 in
WEIGHTICAEL: 1901.25 [oz_av]

## 2017-05-05 LAB — HEPARIN LEVEL (UNFRACTIONATED): Heparin Unfractionated: 0.47 IU/mL (ref 0.30–0.70)

## 2017-05-05 NOTE — Progress Notes (Signed)
ANTICOAGULATION CONSULT NOTE   Pharmacy Consult for heparin Indication: pulmonary embolus  No Known Allergies  Patient Measurements: Height: 5' (152.4 cm) Weight: 118 lb 13.3 oz (53.9 kg) IBW/kg (Calculated) : 45.5 HEPARIN DW (KG): 53.9   Vital Signs: Temp: 98.4 F (36.9 C) (03/16 0616) Temp Source: Oral (03/16 0616) BP: 128/68 (03/16 0616) Pulse Rate: 72 (03/16 0616)  Labs: Recent Labs    05/03/17 2330 05/04/17 0533 05/04/17 1203 05/04/17 1750 05/04/17 2307 05/05/17 0656  HGB 14.5  --   --   --   --  11.8*  HCT 44.9  --   --   --   --  38.0  PLT 202  --   --   --   --  246  APTT 29  --   --   --   --   --   LABPROT 12.8  --   --   --   --   --   INR 0.97  --   --   --   --   --   HEPARINUNFRC  --   --  0.78*  --  0.66 0.47  CREATININE 0.62  --   --   --   --   --   TROPONINI <0.03 <0.03 <0.03 <0.03  --   --    Estimated Creatinine Clearance: 43.6 mL/min (by C-G formula based on SCr of 0.62 mg/dL).  Medical History: Past Medical History:  Diagnosis Date  . Abnormality of gait 06/19/2013  . Anxiety   . Arthritis   . DVT (deep venous thrombosis) (HCC)    rt  . Hyperlipemia   . Hypertension   . Memory difficulties 06/19/2013  . Migraine   . Varicose veins    legs    Medications:  Scheduled:  . atorvastatin  40 mg Oral q1800  . pantoprazole  40 mg Oral BID   Infusions:  . sodium chloride 50 mL/hr at 05/04/17 2148  . cefTRIAXone (ROCEPHIN)  IV Stopped (05/05/17 0422)  . heparin 850 Units/hr (05/05/17 0420)   PRN: acetaminophen **OR** acetaminophen, ondansetron **OR** ondansetron (ZOFRAN) IV  Assessment: 77 yo female with PMH HTN, HLD, and dementia.  Came to ED c/o abdominal pain and nausea.  CT showed bilateral PE.  Spoke with patient's daughter who confirmed she does not take any anticoagulants at home.    Goal of Therapy:  Heparin level 0.3-0.7 units/ml   Plan:  Continue heparin infusion at 850 units/hr Check anti-Xa level daily while on  heparin Continue to monitor H&H and platelets  Judeth CornfieldSteven Kanda Deluna, PharmD Clinical Pharmacist 05/05/2017 10:17 AM

## 2017-05-05 NOTE — Progress Notes (Signed)
PROGRESS NOTE  Julia Patel:[id number] DOB: [birth year] DOA: 05/03/2017 PCP: Joette Catching, MD  Brief History:   77 y.o. female with medical history of dementia, right lower extremity DVT, hypertension, hyperlipidemia presenting with 2-3-day history of abdominal pain.  The family initially felt this may been due to eating some unusual red meat.  There is not been any fevers, chills, chest pain, shortness breath, coughing, hemoptysis, nausea, vomiting, diarrhea, dysuria, hematuria, headaches.  The patient is usually ambulatory without any assistive devices.  There is no history of malignancy.  There is no recent travels.  CT of the abdomen and pelvis was obtained in the emergency department.  There was incidental finding of bilateral lower lobe pulmonary emboli with evidence of right heart strain.  As result, the patient was admitted for further evaluation and treatment.  The patient has not had any recent surgeries or any new changes in her medications recently. The patient's daughter subsequently arrived and stated that the patient has had a history of lower extremity DVT approximately 10 years ago.  The patient was on warfarin at that time, but it is unclear how long the patient took the warfarin.  Her daughter states that the patient has been off of all types of anticoagulation for at least 5 years.  After admission, the patient was started on intravenous heparin.  Echocardiogram was ordered.   Assessment/Plan: Acute bilateral pulmonary emboli -Unprovoked by clinical history -There is suggestion of right heart strain on CT -Continue heparin drip -Echocardiogram--EF 60-65%, grade 1 DD, normal RV function -with hx of LE DVT, will need lifelong AC -check factor V leiden, lupus AC, prothrombin gene mutation -stable on RA  Abdominal pain -Etiology unclear, but suspect gastritis -continue PPI -improved -start soft diet -05/04/2017 CT abdomen and pelvis short segment small  bowel herniation into the left inguinal hernia without obstruction; mild stomach wall thickening; no hydronephrosis or obstructive renal stones  Hyperlipidemia -Continue statin  Dementia without behavioral disturbance -Continue Aricept  Pyuria -d/c ceftriaxone--culture unrevealing  Impaired glucose tolerance -06/06/2016 hemoglobin A1c 5.7 -Allow for low blood glycemic control at this point    Disposition Plan:   Home 3/18 if stable Family Communication:  No Family at bedside  Consultants: none   Code Status:  FULL / DNR  DVT Prophylaxis:  IV Heparin   Procedures: As Listed in Progress Note Above  Antibiotics: None    Subjective: Patient denies fevers, chills, headache, chest pain, dyspnea, nausea, vomiting, diarrhea, abdominal pain, dysuria, hematuria, hematochezia, and melena.   Objective: Vitals:   05/04/17 1606 05/04/17 2254 05/05/17 0616 05/05/17 1500  BP: (!) 141/76 99/66 128/68 118/86  Pulse: 92 88 72 98  Resp: 19 20  16   Temp: 98 F (36.7 C) 99 F (37.2 C) 98.4 F (36.9 C) 98.5 F (36.9 C)  TempSrc: Oral Axillary Oral Oral  SpO2: 99% 92% 98% 97%  Weight:      Height:        Intake/Output Summary (Last 24 hours) at 05/05/2017 1744 Last data filed at 05/05/2017 1600 Gross per 24 hour  Intake 1704.59 ml  Output -  Net 1704.59 ml   Weight change: 1.283 kg (2 lb 13.3 oz) Exam:   General:  Pt is alert, follows commands appropriately, not in acute distress  HEENT: No icterus, No thrush, No neck mass, [place]/AT  Cardiovascular: RRR, S1/S2, no rubs, no gallops  Respiratory: CTA bilaterally, no wheezing, no crackles, no rhonchi  Abdomen: Soft/+BS, non tender, non distended, no guarding  Extremities: 1+LE edema, No lymphangitis, No petechiae, No rashes, no synovitis   Data Reviewed: I have personally reviewed following labs and imaging studies Basic Metabolic Panel: Recent Labs  Lab 05/03/17 2330 05/05/17 0656  NA 140 137  K 4.2 3.6    CL 103 103  CO2 26 24  GLUCOSE 111* 81  BUN 15 13  CREATININE 0.62 0.64  CALCIUM 9.7 8.8*   Liver Function Tests: Recent Labs  Lab 05/03/17 2330  AST 19  ALT 14  ALKPHOS 89  BILITOT 0.7  PROT 8.6*  ALBUMIN 4.2   Recent Labs  Lab 05/03/17 2330  LIPASE 38   No results for input(s): AMMONIA in the last 168 hours. Coagulation Profile: Recent Labs  Lab 05/03/17 2330  INR 0.97   CBC: Recent Labs  Lab 05/03/17 2330 05/05/17 0656  WBC 7.8 6.5  NEUTROABS 5.6  --   HGB 14.5 11.8*  HCT 44.9 38.0  MCV 88.2 89.8  PLT 202 246   Cardiac Enzymes: Recent Labs  Lab 05/03/17 2330 05/04/17 0533 05/04/17 1203 05/04/17 1750  TROPONINI <0.03 <0.03 <0.03 <0.03   BNP: Invalid input(s): POCBNP CBG: No results for input(s): GLUCAP in the last 168 hours. HbA1C: No results for input(s): HGBA1C in the last 72 hours. Urine analysis:    Component Value Date/Time   COLORURINE YELLOW 05/03/2017 2045   APPEARANCEUR CLOUDY (A) 05/03/2017 2045   LABSPEC 1.023 05/03/2017 2045   PHURINE 5.0 05/03/2017 2045   GLUCOSEU NEGATIVE 05/03/2017 2045   HGBUR NEGATIVE 05/03/2017 2045   BILIRUBINUR NEGATIVE 05/03/2017 2045   KETONESUR NEGATIVE 05/03/2017 2045   PROTEINUR NEGATIVE 05/03/2017 2045   UROBILINOGEN 1.0 08/23/2013 1144   NITRITE NEGATIVE 05/03/2017 2045   LEUKOCYTESUR LARGE (A) 05/03/2017 2045   Sepsis Labs: @LABRCNTIP (procalcitonin:4,lacticidven:4) ) Recent Results (from the past 240 hour(s))  Culture, Urine     Status: Abnormal   Collection Time: 05/03/17  8:45 PM  Result Value Ref Range Status   Specimen Description   Final    URINE, RANDOM Performed at [facility], [address] [place], [place] [address]    Special Requests   Final    NONE Performed at [facility], [address], [place], [place] [address]    Culture MULTIPLE SPECIES PRESENT, SUGGEST RECOLLECTION (A)  Final   Report Status 05/05/2017 FINAL  Final     Scheduled Meds: . atorvastatin  40  mg Oral q1800  . pantoprazole  40 mg Oral BID   Continuous Infusions: . sodium chloride 50 mL/hr at 05/04/17 2148  . heparin 850 Units/hr (05/05/17 0420)    Procedures/Studies: Ct Abdomen Pelvis W Contrast  Result Date: 05/04/2017 CLINICAL DATA:  Acute onset of generalized abdominal pain and nausea. EXAM: CT ABDOMEN AND PELVIS WITH CONTRAST TECHNIQUE: Multidetector CT imaging of the abdomen and pelvis was performed using the standard protocol following bolus administration of intravenous contrast. CONTRAST:  ISOVUE-300 IOPAMIDOL (ISOVUE-300) INJECTION 61% COMPARISON:  None. FINDINGS: Lower chest: Pulmonary emboli are noted within the pulmonary arteries to both lower lung lobes. There is prominence of the right side of the heart, raising concern for right heart strain. Hepatobiliary: Scattered cysts within the liver measure up to 3.7 cm in size. The patient is status post cholecystectomy, with clips noted at the gallbladder fossa. The common bile duct remains normal in caliber. Pancreas: The pancreas is within normal limits. Spleen: The spleen is unremarkable in appearance. Adrenals/Urinary Tract: The adrenal  glands are unremarkable in appearance. Mild right renal atrophy and scarring is noted. Nonobstructing left renal stones measure up to 4 mm in size. There is no evidence of hydronephrosis. No obstructing ureteral stones are seen. No perinephric stranding is appreciated. Stomach/Bowel: Mild wall thickening at the stomach may reflect mild gastritis. The small bowel is within normal limits. The appendix is normal in caliber, without evidence of appendicitis. The colon is unremarkable in appearance. Vascular/Lymphatic: The stomach is unremarkable in appearance. There is herniation of a short segment of small bowel into a left inguinal hernia, without evidence for obstruction. The small bowel is otherwise unremarkable. The appendix is normal in caliber, without evidence of appendicitis. Scattered  diverticulosis is noted along the descending and sigmoid colon, without evidence of diverticulitis. Reproductive: The bladder is mildly distended and grossly unremarkable. The uterus is unremarkable in appearance. No suspicious adnexal masses are seen. Other: No additional soft tissue abnormalities are seen. Musculoskeletal: No acute osseous abnormalities are identified. The visualized musculature is unremarkable in appearance. IMPRESSION: 1. Pulmonary emboli within the pulmonary arteries to both lower lung lobes. Prominence of the right side of the heart raises concern for right heart strain. 2. Mild wall thickening at the stomach may reflect mild gastritis. 3. Herniation of a short segment of small bowel into a left inguinal hernia, without evidence for obstruction. 4. Scattered hepatic cysts noted. Mild right renal atrophy and scarring. Nonobstructing left renal stones seen. 5. Scattered diverticulosis along the descending and sigmoid colon, without evidence of diverticulitis. Critical Value/emergent results were called by telephone at the time of interpretation on 05/04/2017 at 1:57 am to Dr. Judd Lienelo, who verbally acknowledged these results. Electronically Signed   By: Roanna RaiderJeffery  Chang M.D.   On: 05/04/2017 02:10   [place] Venous Img Lower Bilateral  Result Date: 05/04/2017 CLINICAL DATA:  Pulmonary embolism.  Evaluate for DVT. EXAM: BILATERAL LOWER EXTREMITY VENOUS DOPPLER ULTRASOUND TECHNIQUE: Gray-scale sonography with graded compression, as well as color Doppler and duplex ultrasound were performed to evaluate the lower extremity deep venous systems from the level of the common femoral vein and including the common femoral, femoral, profunda femoral, popliteal and calf veins including the posterior tibial, peroneal and gastrocnemius veins when visible. The superficial great saphenous vein was also interrogated. Spectral Doppler was utilized to evaluate flow at rest and with distal augmentation maneuvers in the  common femoral, femoral and popliteal veins. COMPARISON:  None. FINDINGS: RIGHT LOWER EXTREMITY Common Femoral Vein: No evidence of thrombus. Normal compressibility, respiratory phasicity and response to augmentation. Saphenofemoral Junction: No evidence of thrombus. Normal compressibility and flow on color Doppler imaging. Profunda Femoral Vein: No evidence of thrombus. Normal compressibility and flow on color Doppler imaging. Femoral Vein: There is hypoechoic occlusive thrombus seen within the proximal mid aspects the right femoral vein with partial recanalization of the distal aspect. Popliteal Vein: No evidence of thrombus. Normal compressibility, respiratory phasicity and response to augmentation. Calf Veins: No evidence of thrombus. Normal compressibility and flow on color Doppler imaging. Superficial Great Saphenous Vein: There is hypoechoic occlusive thrombus within the greater saphenous vein at the level of thigh. (Images 19 through 22). The greater saphenous vein appears patent distally (image 31). Other Findings:  None. LEFT LOWER EXTREMITY Common Femoral Vein: No evidence of thrombus. Normal compressibility, respiratory phasicity and response to augmentation. Saphenofemoral Junction: No evidence of thrombus. Normal compressibility and flow on color Doppler imaging. Profunda Femoral Vein: No evidence of thrombus. Normal compressibility and flow on color Doppler imaging. Femoral Vein: There  is hypoechoic occlusive thrombus seen throughout the imaged course of the left femoral vein. Popliteal Vein: There is hypoechoic occlusive thrombus seen within the left popliteal vein. Calf Veins: Appear patent where imaged. Superficial Great Saphenous Vein: No evidence of thrombus. Normal compressibility. Venous Reflux:  None. Other Findings:  None. IMPRESSION: 1. Examination is positive for predominantly occlusive DVT involving the bilateral femoral veins as well as the left popliteal vein. 2. Occlusive SVT involving  the right greater saphenous vein. Electronically Signed   By: Simonne Come M.D.   On: 05/04/2017 09:07   Dg Chest Portable 1 View  Result Date: 05/04/2017 CLINICAL DATA:  Tachypnea EXAM: PORTABLE CHEST 1 VIEW COMPARISON:  Chest radiograph 08/23/2013 FINDINGS: Shallow lung inflation with bibasilar atelectasis. No focal consolidation. No pneumothorax or sizable pleural effusion. IMPRESSION: Shallow lung inflation with bibasilar atelectasis. Electronically Signed   By: Deatra Robinson M.D.   On: 05/04/2017 01:31    Catarina Hartshorn, DO  [facility] Pager [phone]  If 7PM-7AM, please contact night-coverage [email or web address] Password [email or web address] 05/05/2017, 5:44 PM   LOS: 1 day

## 2017-05-05 NOTE — Plan of Care (Signed)
progressing 

## 2017-05-05 NOTE — Progress Notes (Signed)
*  PRELIMINARY RESULTS* Echocardiogram 2D Echocardiogram has been performed.  Stacey DrainWhite, Yudit Modesitt J 05/05/2017, 2:46 PM

## 2017-05-06 LAB — CBC
HCT: 31.7 % — ABNORMAL LOW (ref 36.0–46.0)
Hemoglobin: 10.3 g/dL — ABNORMAL LOW (ref 12.0–15.0)
MCH: 28.7 pg (ref 26.0–34.0)
MCHC: 32.5 g/dL (ref 30.0–36.0)
MCV: 88.3 fL (ref 78.0–100.0)
PLATELETS: 244 10*3/uL (ref 150–400)
RBC: 3.59 MIL/uL — ABNORMAL LOW (ref 3.87–5.11)
RDW: 14.7 % (ref 11.5–15.5)
WBC: 5.6 10*3/uL (ref 4.0–10.5)

## 2017-05-06 LAB — HEPARIN LEVEL (UNFRACTIONATED): Heparin Unfractionated: 0.54 IU/mL (ref 0.30–0.70)

## 2017-05-06 MED ORDER — APIXABAN 5 MG PO TABS: 5.0000 mg | ORAL_TABLET | Freq: Two times a day (BID) | ORAL | Status: DC

## 2017-05-06 MED ORDER — APIXABAN 5 MG PO TABS
10.0000 mg | ORAL_TABLET | Freq: Two times a day (BID) | ORAL | Status: DC
  Administered 2017-05-06 – 2017-05-07 (×2): 10 mg via ORAL
  Filled 2017-05-06 (×3): qty 2

## 2017-05-06 NOTE — Progress Notes (Signed)
PROGRESS NOTE  Julia Patel ZOX:[id number] DOB: Dec 12, 1940 DOA: 05/03/2017 PCP: Joette Catching, MD  Brief History:  77 y.o.femalewith medical history ofdementia, right lower extremity DVT, hypertension, hyperlipidemia presenting with 2-3-day history of abdominal pain. The family initially felt this may been due to eating some unusual red meat.  There is not been any fevers, chills, chest pain, shortness breath, coughing, hemoptysis, nausea, vomiting, diarrhea, dysuria, hematuria, headaches. The patient is usually ambulatory without any assistive devices. There is no history of malignancy. There is no recent travels. CT of the abdomen and pelvis was obtained in the emergency department. There was incidental finding of bilateral lower lobe pulmonary emboli with evidence of right heart strain. As result, the patient was admitted for further evaluation and treatment. The patient has not had any recent surgeries or any new changes in her medications recently. The patient's daughter subsequently arrived and stated that the patient has had a history of lower extremity DVT approximately 10 years ago. The patient was on warfarin at that time, but it is unclear how long the patient took the warfarin. Her daughter states that the patient has been off of all types of anticoagulation for at least 5 years.  After admission, the patient was started on intravenous heparin.  Echocardiogram was ordered.   Assessment/Plan: Acute bilateral pulmonary emboli -Unprovoked by clinical history -There is suggestion of right heart strain on CT -Continue heparin drip>>>switch to po apixaban 3/17 -Echocardiogram--EF 60-65%, grade 1 DD, normal RV function -with hx of LE DVT, will need lifelong AC -check factor V leiden, lupus AC, prothrombin gene mutation -stable on RA  Abdominal pain -Etiology unclear, but suspect gastritis -continue PPI -improved -advanced diet-->tolerating -05/04/2017 CT  abdomen and pelvis short segment small bowel herniation into the left inguinal hernia without obstruction; mild stomach wall thickening; no hydronephrosis or obstructive renal stones  Hyperlipidemia -Continue statin  Dementia without behavioral disturbance -Continue Aricept  Pyuria -d/c ceftriaxone--culture unrevealing  Impaired glucose tolerance -06/06/2016 hemoglobin A1c 5.7 -Allow for liberal blood glycemic control at this point    Disposition Plan:   Home 3/18 if stable Family Communication:  Granddaughters updated at bedside 3/17  Consultants: none   Code Status:  FULL  DVT Prophylaxis:  IV Heparin>>>apixaban   Procedures: As Listed in Progress Note Above  Antibiotics: None       Subjective: Patient denies fevers, chills, headache, chest pain, dyspnea, nausea, vomiting, diarrhea, abdominal pain, dysuria, hematuria, hematochezia, and melena.   Objective: Vitals:   05/05/17 1500 05/05/17 2033 05/06/17 0500 05/06/17 1400  BP: 118/86 111/66 112/68 126/73  Pulse: 98 79 75 68  Resp: 16  16 16   Temp: 98.5 F (36.9 C) 99 F (37.2 C) 99 F (37.2 C) 98.8 F (37.1 C)  TempSrc: Oral Oral Oral Oral  SpO2: 97% 95% 97% 99%  Weight:   53.9 kg (118 lb 13.3 oz)   Height:        Intake/Output Summary (Last 24 hours) at 05/06/2017 1601 Last data filed at 05/06/2017 1000 Gross per 24 hour  Intake 1293 ml  Output -  Net 1293 ml   Weight change: 0 kg (0 lb) Exam:   General:  Pt is alert, follows commands appropriately, not in acute distress  HEENT: No icterus, No thrush, No neck mass, /AT  Cardiovascular: RRR, S1/S2, no rubs, no gallops  Respiratory: Bibasilar crackles but no wheezing.  Good air movement.  Abdomen: Soft/+BS, non tender, non distended,  no guarding  Extremities: No edema, No lymphangitis, No petechiae, No rashes, no synovitis   Data Reviewed: I have personally reviewed following labs and imaging studies Basic Metabolic  Panel: Recent Labs  Lab 05/03/17 2330 05/05/17 0656  NA 140 137  K 4.2 3.6  CL 103 103  CO2 26 24  GLUCOSE 111* 81  BUN 15 13  CREATININE 0.62 0.64  CALCIUM 9.7 8.8*   Liver Function Tests: Recent Labs  Lab 05/03/17 2330  AST 19  ALT 14  ALKPHOS 89  BILITOT 0.7  PROT 8.6*  ALBUMIN 4.2   Recent Labs  Lab 05/03/17 2330  LIPASE 38   No results for input(s): AMMONIA in the last 168 hours. Coagulation Profile: Recent Labs  Lab 05/03/17 2330  INR 0.97   CBC: Recent Labs  Lab 05/03/17 2330 05/05/17 0656 05/06/17 0327  WBC 7.8 6.5 5.6  NEUTROABS 5.6  --   --   HGB 14.5 11.8* 10.3*  HCT 44.9 38.0 31.7*  MCV 88.2 89.8 88.3  PLT 202 246 244   Cardiac Enzymes: Recent Labs  Lab 05/03/17 2330 05/04/17 0533 05/04/17 1203 05/04/17 1750  TROPONINI <0.03 <0.03 <0.03 <0.03   BNP: Invalid input(s): POCBNP CBG: No results for input(s): GLUCAP in the last 168 hours. HbA1C: No results for input(s): HGBA1C in the last 72 hours. Urine analysis:    Component Value Date/Time   COLORURINE YELLOW 05/03/2017 2045   APPEARANCEUR CLOUDY (A) 05/03/2017 2045   LABSPEC 1.023 05/03/2017 2045   PHURINE 5.0 05/03/2017 2045   GLUCOSEU NEGATIVE 05/03/2017 2045   HGBUR NEGATIVE 05/03/2017 2045   BILIRUBINUR NEGATIVE 05/03/2017 2045   KETONESUR NEGATIVE 05/03/2017 2045   PROTEINUR NEGATIVE 05/03/2017 2045   UROBILINOGEN 1.0 08/23/2013 1144   NITRITE NEGATIVE 05/03/2017 2045   LEUKOCYTESUR LARGE (A) 05/03/2017 2045   Sepsis Labs: @LABRCNTIP (procalcitonin:4,lacticidven:4) ) Recent Results (from the past 240 hour(s))  Culture, Urine     Status: Abnormal   Collection Time: 05/03/17  8:45 PM  Result Value Ref Range Status   Specimen Description   Final    URINE, RANDOM Performed at [facility], [address] [place], [place] [address]    Special Requests   Final    NONE Performed at [facility], [address] [place], [place] [address]    Culture MULTIPLE SPECIES  PRESENT, SUGGEST RECOLLECTION (A)  Final   Report Status 05/05/2017 FINAL  Final     Scheduled Meds: . atorvastatin  40 mg Oral q1800  . pantoprazole  40 mg Oral BID   Continuous Infusions: . heparin 850 Units/hr (05/06/17 1019)    Procedures/Studies: Ct Abdomen Pelvis W Contrast  Result Date: 05/04/2017 CLINICAL DATA:  Acute onset of generalized abdominal pain and nausea. EXAM: CT ABDOMEN AND PELVIS WITH CONTRAST TECHNIQUE: Multidetector CT imaging of the abdomen and pelvis was performed using the standard protocol following bolus administration of intravenous contrast. CONTRAST:  100mL ISOVUE-300 IOPAMIDOL (ISOVUE-300) INJECTION 61% COMPARISON:  None. FINDINGS: Lower chest: Pulmonary emboli are noted within the pulmonary arteries to both lower lung lobes. There is prominence of the right side of the heart, raising concern for right heart strain. Hepatobiliary: Scattered cysts within the liver measure up to 3.7 cm in size. The patient is status post cholecystectomy, with clips noted at the gallbladder fossa. The common bile duct remains normal in caliber. Pancreas: The pancreas is within normal limits. Spleen: The spleen is unremarkable in appearance. Adrenals/Urinary Tract: The adrenal glands are unremarkable in appearance. Mild  right renal atrophy and scarring is noted. Nonobstructing left renal stones measure up to 4 mm in size. There is no evidence of hydronephrosis. No obstructing ureteral stones are seen. No perinephric stranding is appreciated. Stomach/Bowel: Mild wall thickening at the stomach may reflect mild gastritis. The small bowel is within normal limits. The appendix is normal in caliber, without evidence of appendicitis. The colon is unremarkable in appearance. Vascular/Lymphatic: The stomach is unremarkable in appearance. There is herniation of a short segment of small bowel into a left inguinal hernia, without evidence for obstruction. The small bowel is otherwise unremarkable. The  appendix is normal in caliber, without evidence of appendicitis. Scattered diverticulosis is noted along the descending and sigmoid colon, without evidence of diverticulitis. Reproductive: The bladder is mildly distended and grossly unremarkable. The uterus is unremarkable in appearance. No suspicious adnexal masses are seen. Other: No additional soft tissue abnormalities are seen. Musculoskeletal: No acute osseous abnormalities are identified. The visualized musculature is unremarkable in appearance. IMPRESSION: 1. Pulmonary emboli within the pulmonary arteries to both lower lung lobes. Prominence of the right side of the heart raises concern for right heart strain. 2. Mild wall thickening at the stomach may reflect mild gastritis. 3. Herniation of a short segment of small bowel into a left inguinal hernia, without evidence for obstruction. 4. Scattered hepatic cysts noted. Mild right renal atrophy and scarring. Nonobstructing left renal stones seen. 5. Scattered diverticulosis along the descending and sigmoid colon, without evidence of diverticulitis. Critical Value/emergent results were called by telephone at the time of interpretation on 05/04/2017 at 1:57 am to Dr. Judd Lien, who verbally acknowledged these results. Electronically Signed   By: Roanna Raider M.D.   On: 05/04/2017 02:10   US Venous Img Lower Bilateral  Result Date: 05/04/2017 CLINICAL DATA:  Pulmonary embolism.  Evaluate for DVT. EXAM: BILATERAL LOWER EXTREMITY VENOUS DOPPLER ULTRASOUND TECHNIQUE: Gray-scale sonography with graded compression, as well as color Doppler and duplex ultrasound were performed to evaluate the lower extremity deep venous systems from the level of the common femoral vein and including the common femoral, femoral, profunda femoral, popliteal and calf veins including the posterior tibial, peroneal and gastrocnemius veins when visible. The superficial great saphenous vein was also interrogated. Spectral Doppler was utilized  to evaluate flow at rest and with distal augmentation maneuvers in the common femoral, femoral and popliteal veins. COMPARISON:  None. FINDINGS: RIGHT LOWER EXTREMITY Common Femoral Vein: No evidence of thrombus. Normal compressibility, respiratory phasicity and response to augmentation. Saphenofemoral Junction: No evidence of thrombus. Normal compressibility and flow on color Doppler imaging. Profunda Femoral Vein: No evidence of thrombus. Normal compressibility and flow on color Doppler imaging. Femoral Vein: There is hypoechoic occlusive thrombus seen within the proximal mid aspects the right femoral vein with partial recanalization of the distal aspect. Popliteal Vein: No evidence of thrombus. Normal compressibility, respiratory phasicity and response to augmentation. Calf Veins: No evidence of thrombus. Normal compressibility and flow on color Doppler imaging. Superficial Great Saphenous Vein: There is hypoechoic occlusive thrombus within the greater saphenous vein at the level of thigh. (Images 19 through 22). The greater saphenous vein appears patent distally (image 31). Other Findings:  None. LEFT LOWER EXTREMITY Common Femoral Vein: No evidence of thrombus. Normal compressibility, respiratory phasicity and response to augmentation. Saphenofemoral Junction: No evidence of thrombus. Normal compressibility and flow on color Doppler imaging. Profunda Femoral Vein: No evidence of thrombus. Normal compressibility and flow on color Doppler imaging. Femoral Vein: There is hypoechoic occlusive thrombus seen throughout  the imaged course of the left femoral vein. Popliteal Vein: There is hypoechoic occlusive thrombus seen within the left popliteal vein. Calf Veins: Appear patent where imaged. Superficial Great Saphenous Vein: No evidence of thrombus. Normal compressibility. Venous Reflux:  None. Other Findings:  None. IMPRESSION: 1. Examination is positive for predominantly occlusive DVT involving the bilateral  femoral veins as well as the left popliteal vein. 2. Occlusive SVT involving the right greater saphenous vein. Electronically Signed   By: Simonne Come M.D.   On: 05/04/2017 09:07   Dg Chest Portable 1 View  Result Date: 05/04/2017 CLINICAL DATA:  Tachypnea EXAM: PORTABLE CHEST 1 VIEW COMPARISON:  Chest radiograph 08/23/2013 FINDINGS: Shallow lung inflation with bibasilar atelectasis. No focal consolidation. No pneumothorax or sizable pleural effusion. IMPRESSION: Shallow lung inflation with bibasilar atelectasis. Electronically Signed   By: Deatra Robinson M.D.   On: 05/04/2017 01:31    Catarina Hartshorn, DO  [facility] Pager [phone]  If 7PM-7AM, please contact night-coverage [email or web address] Password [email or web address] 05/06/2017, 4:01 PM   LOS: 2 days

## 2017-05-06 NOTE — Progress Notes (Addendum)
ANTICOAGULATION CONSULT NOTE   Pharmacy Consult for heparin Indication: pulmonary embolus  No Known Allergies  Patient Measurements: Height: 5' (152.4 cm) Weight: 118 lb 13.3 oz (53.9 kg) IBW/kg (Calculated) : 45.5 HEPARIN DW (KG): 53.9   Vital Signs: Temp: 99 F (37.2 C) (03/17 0500) Temp Source: Oral (03/17 0500) BP: 112/68 (03/17 0500) Pulse Rate: 75 (03/17 0500)  Labs: Recent Labs    05/03/17 2330 05/04/17 0533  05/04/17 1203 05/04/17 1750 05/04/17 2307 05/05/17 0656 05/06/17 0327  HGB 14.5  --   --   --   --   --  11.8* 10.3*  HCT 44.9  --   --   --   --   --  38.0 31.7*  PLT 202  --   --   --   --   --  246 244  APTT 29  --   --   --   --   --   --   --   LABPROT 12.8  --   --   --   --   --   --   --   INR 0.97  --   --   --   --   --   --   --   HEPARINUNFRC  --   --    < > 0.78*  --  0.66 0.47 0.54  CREATININE 0.62  --   --   --   --   --  0.64  --   TROPONINI <0.03 <0.03  --  <0.03 <0.03  --   --   --    < > = values in this interval not displayed.   Estimated Creatinine Clearance: 43.6 mL/min (by C-G formula based on SCr of 0.64 mg/dL).  Medical History: Past Medical History:  Diagnosis Date  . Abnormality of gait 06/19/2013  . Anxiety   . Arthritis   . DVT (deep venous thrombosis) (HCC)    rt  . Hyperlipemia   . Hypertension   . Memory difficulties 06/19/2013  . Migraine   . Varicose veins    legs    Medications:  Scheduled:  . atorvastatin  40 mg Oral q1800  . pantoprazole  40 mg Oral BID   Infusions:  . sodium chloride 50 mL/hr at 05/06/17 1019  . heparin 850 Units/hr (05/06/17 1019)   PRN: acetaminophen **OR** acetaminophen, ondansetron **OR** ondansetron (ZOFRAN) IV  Assessment: 77 yo female with PMH HTN, HLD, and dementia.  Came to ED c/o abdominal pain and nausea.  CT showed bilateral PE.  Spoke with patient's daughter who confirmed she does not take any anticoagulants at home.  Heparin level therapeutic today 3/17 @ 0.54  Goal  of Therapy:  Heparin level 0.3-0.7 units/ml   Plan:  Continue heparin infusion at 850 units/hr Check anti-Xa level daily while on heparin Continue to monitor H&H and platelets  Judeth CornfieldSteven Erick Oxendine, PharmD Clinical Pharmacist 05/06/2017 11:07 AM

## 2017-05-06 NOTE — Progress Notes (Signed)
ANTICOAGULATION CONSULT NOTE - Initial Consult  Pharmacy Consult for apixaban Indication: pulmonary embolus  No Known Allergies  Patient Measurements: Height: 5' (152.4 cm) Weight: 118 lb 13.3 oz (53.9 kg) IBW/kg (Calculated) : 45.5   Vital Signs: Temp: 98.8 F (37.1 C) (03/17 1400) Temp Source: Oral (03/17 1400) BP: 126/73 (03/17 1400) Pulse Rate: 68 (03/17 1400)  Labs: Recent Labs    05/03/17 2330 05/04/17 0533  05/04/17 1203 05/04/17 1750 05/04/17 2307 05/05/17 0656 05/06/17 0327  HGB 14.5  --   --   --   --   --  11.8* 10.3*  HCT 44.9  --   --   --   --   --  38.0 31.7*  PLT 202  --   --   --   --   --  246 244  APTT 29  --   --   --   --   --   --   --   LABPROT 12.8  --   --   --   --   --   --   --   INR 0.97  --   --   --   --   --   --   --   HEPARINUNFRC  --   --    < > 0.78*  --  0.66 0.47 0.54  CREATININE 0.62  --   --   --   --   --  0.64  --   TROPONINI <0.03 <0.03  --  <0.03 <0.03  --   --   --    < > = values in this interval not displayed.    Estimated Creatinine Clearance: 43.6 mL/min (by C-G formula based on SCr of 0.64 mg/dL).   Medical History: Past Medical History:  Diagnosis Date  . Abnormality of gait 06/19/2013  . Anxiety   . Arthritis   . DVT (deep venous thrombosis) (HCC)    rt  . Hyperlipemia   . Hypertension   . Memory difficulties 06/19/2013  . Migraine   . Varicose veins    legs    Medications:  Medications Prior to Admission  Medication Sig Dispense Refill Last Dose  . donepezil (ARICEPT) 10 MG tablet Take 10 mg by mouth daily.   05/03/2017 at Unknown time  . Multiple Vitamin (MULTIVITAMIN WITH MINERALS) TABS tablet Take 1 tablet by mouth daily.   Past Week at Unknown time  . sodium-potassium bicarbonate (ALKA-SELTZER GOLD) TBEF dissolvable tablet Take 1 tablet by mouth daily as needed.   05/03/2017 at Unknown time    Assessment: Pharmacy has been consulted to initiate apixaban in patient with acute pulmonary embolism  transitioning from heparin.  Goal of Therapy:   Monitor platelets by anticoagulation protocol: Yes   Plan:  Apixaban 10 mg BID x 7 days Followed by apixaban 5 mg BID Monitor labs and s/s of bleeding   Judeth CornfieldSteven Jassica Zazueta, PharmD Clinical Pharmacist 05/06/2017 4:15 PM

## 2017-05-07 LAB — CBC
HEMATOCRIT: 34.4 % — AB (ref 36.0–46.0)
HEMOGLOBIN: 10.9 g/dL — AB (ref 12.0–15.0)
MCH: 28 pg (ref 26.0–34.0)
MCHC: 31.7 g/dL (ref 30.0–36.0)
MCV: 88.4 fL (ref 78.0–100.0)
Platelets: 291 10*3/uL (ref 150–400)
RBC: 3.89 MIL/uL (ref 3.87–5.11)
RDW: 14.6 % (ref 11.5–15.5)
WBC: 4 10*3/uL (ref 4.0–10.5)

## 2017-05-07 LAB — BASIC METABOLIC PANEL
ANION GAP: 9 (ref 5–15)
BUN: 9 mg/dL (ref 6–20)
CO2: 23 mmol/L (ref 22–32)
CREATININE: 0.6 mg/dL (ref 0.44–1.00)
Calcium: 8.6 mg/dL — ABNORMAL LOW (ref 8.9–10.3)
Chloride: 108 mmol/L (ref 101–111)
GFR calc non Af Amer: >60 mL/min (ref >60)
Glucose, Bld: 89 mg/dL (ref 65–99)
POTASSIUM: 3.3 mmol/L — AB (ref 3.5–5.1)
Sodium: 140 mmol/L (ref 135–145)

## 2017-05-07 LAB — LUPUS ANTICOAGULANT PANEL
DRVVT: 43.8 s (ref 0.0–47.0)
PTT LA: 43.1 s (ref 0.0–51.9)

## 2017-05-07 LAB — HEPARIN LEVEL (UNFRACTIONATED): Heparin Unfractionated: 3.04 IU/mL — ABNORMAL HIGH (ref 0.30–0.70)

## 2017-05-07 MED ORDER — APIXABAN 5 MG PO TABS: 10.0000 mg | ORAL_TABLET | Freq: Two times a day (BID) | ORAL | 0 refills | Status: AC

## 2017-05-07 NOTE — Care Management Note (Signed)
Case Management Note  Patient Details  Name: Julia BordersMargaret S Patel MRN: [id number] Date of Birth: 1940/06/05  Subjective/Objective:                 Admitted with bilateral PE. Pt from home, lives with daughter. Ind pta. She will DC on Eliquis. PT recommends HH PT.    Action/Plan: DC home today with HH. Pt confused, CM discussed DC plan with daughter via phone. [facility] Aware HH has 48 hrs to make first visit. Olegario MessierKathy, [facility] aware of [facility] and will pull pt info from chart. Given 30-day free card for Eliquis.   Expected Discharge Date:  05/07/17               Expected Discharge Plan:  Home w Home Health Services  In-House Referral:  NA  Discharge planning Services  CM Consult  [facility] offered to:  [facility]  HH Arranged:  RN, PT HH Agency:  [facility]  Status of Service:  Completed, signed off  Malcolm MetroChildress, Issachar Broady Demske, RN 05/07/2017, 11:15 AM

## 2017-05-07 NOTE — Discharge Summary (Addendum)
Physician Discharge Summary  Julia Patel ZOX:[id number] DOB: 02-28-1940 DOA: 05/03/2017  PCP: Joette Catching, MD  Admit date: 05/03/2017 Discharge date: 05/07/2017  Admitted From: Home Disposition:  Home  Recommendations for Outpatient Follow-up:  1. Follow up with PCP in 1-2 weeks 2. Please obtain BMP/CBC in one week    Discharge Condition: Stable CODE STATUS: FULL Diet recommendation:  Regular    Brief/Interim Summary: 77 y.o.femalewith medical history ofdementia, right lower extremity DVT, hypertension, hyperlipidemia presenting with 2-3-day history of abdominal pain. The family initially felt this may been due to eating some unusual red meat.  There is not been any fevers, chills, chest pain, shortness breath, coughing, hemoptysis, nausea, vomiting, diarrhea, dysuria, hematuria, headaches. The patient is usually ambulatory without any assistive devices. There is no history of malignancy. There is no recent travels. CT of the abdomen and pelvis was obtained in the emergency department. There was incidental finding of bilateral lower lobe pulmonary emboli with evidence of right heart strain. As result, the patient was admitted for further evaluation and treatment. The patient has not had any recent surgeries or any new changes in her medications recently. The patient's daughter subsequently arrived and stated that the patient has had a history of lower extremity DVT approximately 10 years ago. The patient was on warfarin at that time, but it is unclear how long the patient took the warfarin. Her daughter states that the patient has been off of all types of anticoagulation for at least 5 years.After admission, the patient was started on intravenous heparin. Echocardiogram was ordered which showed normal RV function.    Discharge Diagnoses:  Acute bilateral pulmonary emboli without cor pulmonale -Unprovoked by clinical history -There is suggestion of right heart  strain on CT -Continue heparin drip>>>switch to po apixaban 3/17 -apixaban 10 mg bid through 3/24 am; start 5 mg bid on 05/13/17 pm -Echocardiogram--EF 60-65%, grade 1 DD, normal RV function -with hx of LE DVT, will need lifelong AC -check factor V leiden, lupus AC, prothrombin gene mutation--pending at time of discharge -stable on RA  Abdominal pain -Etiology unclear, but suspect gastritis -continuePPI -improved -advanced diet-->tolerating -05/04/2017 CT abdomen and pelvis short segment small bowel herniation into the left inguinal hernia without obstruction; mild stomach wall thickening; no hydronephrosis or obstructive renal stones  Dementia without behavioral disturbance -Continue Aricept  Pyuria -d/c ceftriaxone--culture unrevealing  Impaired glucose tolerance -06/06/2016 hemoglobin A1c 5.7 -Allow for liberal blood glycemic control at this point    Discharge Instructions  Discharge Instructions    Diet general   Complete by:  As directed    Increase activity slowly   Complete by:  As directed      Allergies as of 05/07/2017   No Known Allergies     Medication List    TAKE these medications   apixaban 5 MG Tabs tablet Commonly known as:  ELIQUIS Take 2 tablets (10 mg total) by mouth 2 (two) times daily. Through 05/13/17 AM.  Then start 1 tablet (5 mg) two times daily on 05/13/17 at 9 PM Start taking on:  05/13/2017   donepezil 10 MG tablet Commonly known as:  ARICEPT Take 10 mg by mouth daily.   multivitamin with minerals Tabs tablet Take 1 tablet by mouth daily.   sodium-potassium bicarbonate Tbef dissolvable tablet Commonly known as:  ALKA-SELTZER GOLD Take 1 tablet by mouth daily as needed.       No Known Allergies  Consultations:  none   Procedures/Studies: Ct Abdomen Pelvis W Contrast  Result Date: 05/04/2017 CLINICAL DATA:  Acute onset of generalized abdominal pain and nausea. EXAM: CT ABDOMEN AND PELVIS WITH CONTRAST TECHNIQUE:  Multidetector CT imaging of the abdomen and pelvis was performed using the standard protocol following bolus administration of intravenous contrast. CONTRAST:  100mL ISOVUE-300 IOPAMIDOL (ISOVUE-300) INJECTION 61% COMPARISON:  None. FINDINGS: Lower chest: Pulmonary emboli are noted within the pulmonary arteries to both lower lung lobes. There is prominence of the right side of the heart, raising concern for right heart strain. Hepatobiliary: Scattered cysts within the liver measure up to 3.7 cm in size. The patient is status post cholecystectomy, with clips noted at the gallbladder fossa. The common bile duct remains normal in caliber. Pancreas: The pancreas is within normal limits. Spleen: The spleen is unremarkable in appearance. Adrenals/Urinary Tract: The adrenal glands are unremarkable in appearance. Mild right renal atrophy and scarring is noted. Nonobstructing left renal stones measure up to 4 mm in size. There is no evidence of hydronephrosis. No obstructing ureteral stones are seen. No perinephric stranding is appreciated. Stomach/Bowel: Mild wall thickening at the stomach may reflect mild gastritis. The small bowel is within normal limits. The appendix is normal in caliber, without evidence of appendicitis. The colon is unremarkable in appearance. Vascular/Lymphatic: The stomach is unremarkable in appearance. There is herniation of a short segment of small bowel into a left inguinal hernia, without evidence for obstruction. The small bowel is otherwise unremarkable. The appendix is normal in caliber, without evidence of appendicitis. Scattered diverticulosis is noted along the descending and sigmoid colon, without evidence of diverticulitis. Reproductive: The bladder is mildly distended and grossly unremarkable. The uterus is unremarkable in appearance. No suspicious adnexal masses are seen. Other: No additional soft tissue abnormalities are seen. Musculoskeletal: No acute osseous abnormalities are  identified. The visualized musculature is unremarkable in appearance. IMPRESSION: 1. Pulmonary emboli within the pulmonary arteries to both lower lung lobes. Prominence of the right side of the heart raises concern for right heart strain. 2. Mild wall thickening at the stomach may reflect mild gastritis. 3. Herniation of a short segment of small bowel into a left inguinal hernia, without evidence for obstruction. 4. Scattered hepatic cysts noted. Mild right renal atrophy and scarring. Nonobstructing left renal stones seen. 5. Scattered diverticulosis along the descending and sigmoid colon, without evidence of diverticulitis. Critical Value/emergent results were called by telephone at the time of interpretation on 05/04/2017 at 1:57 am to Dr. Judd Lienelo, who verbally acknowledged these results. Electronically Signed   By: Roanna RaiderJeffery  Chang M.D.   On: 05/04/2017 02:10   [place] Venous Img Lower Bilateral  Result Date: 05/04/2017 CLINICAL DATA:  Pulmonary embolism.  Evaluate for DVT. EXAM: BILATERAL LOWER EXTREMITY VENOUS DOPPLER ULTRASOUND TECHNIQUE: Gray-scale sonography with graded compression, as well as color Doppler and duplex ultrasound were performed to evaluate the lower extremity deep venous systems from the level of the common femoral vein and including the common femoral, femoral, profunda femoral, popliteal and calf veins including the posterior tibial, peroneal and gastrocnemius veins when visible. The superficial great saphenous vein was also interrogated. Spectral Doppler was utilized to evaluate flow at rest and with distal augmentation maneuvers in the common femoral, femoral and popliteal veins. COMPARISON:  None. FINDINGS: RIGHT LOWER EXTREMITY Common Femoral Vein: No evidence of thrombus. Normal compressibility, respiratory phasicity and response to augmentation. Saphenofemoral Junction: No evidence of thrombus. Normal compressibility and flow on color Doppler imaging. Profunda Femoral Vein: No evidence of  thrombus. Normal compressibility and flow on color Doppler imaging.  Femoral Vein: There is hypoechoic occlusive thrombus seen within the proximal mid aspects the right femoral vein with partial recanalization of the distal aspect. Popliteal Vein: No evidence of thrombus. Normal compressibility, respiratory phasicity and response to augmentation. Calf Veins: No evidence of thrombus. Normal compressibility and flow on color Doppler imaging. Superficial Great Saphenous Vein: There is hypoechoic occlusive thrombus within the greater saphenous vein at the level of thigh. (Images 19 through 22). The greater saphenous vein appears patent distally (image 31). Other Findings:  None. LEFT LOWER EXTREMITY Common Femoral Vein: No evidence of thrombus. Normal compressibility, respiratory phasicity and response to augmentation. Saphenofemoral Junction: No evidence of thrombus. Normal compressibility and flow on color Doppler imaging. Profunda Femoral Vein: No evidence of thrombus. Normal compressibility and flow on color Doppler imaging. Femoral Vein: There is hypoechoic occlusive thrombus seen throughout the imaged course of the left femoral vein. Popliteal Vein: There is hypoechoic occlusive thrombus seen within the left popliteal vein. Calf Veins: Appear patent where imaged. Superficial Great Saphenous Vein: No evidence of thrombus. Normal compressibility. Venous Reflux:  None. Other Findings:  None. IMPRESSION: 1. Examination is positive for predominantly occlusive DVT involving the bilateral femoral veins as well as the left popliteal vein. 2. Occlusive SVT involving the right greater saphenous vein. Electronically Signed   By: Simonne Come M.D.   On: 05/04/2017 09:07   Dg Chest Portable 1 View  Result Date: 05/04/2017 CLINICAL DATA:  Tachypnea EXAM: PORTABLE CHEST 1 VIEW COMPARISON:  Chest radiograph 08/23/2013 FINDINGS: Shallow lung inflation with bibasilar atelectasis. No focal consolidation. No pneumothorax or sizable  pleural effusion. IMPRESSION: Shallow lung inflation with bibasilar atelectasis. Electronically Signed   By: Deatra Robinson M.D.   On: 05/04/2017 01:31         Discharge Exam: Vitals:   05/06/17 2012 05/07/17 0532  BP: (!) 133/59 136/83  Pulse: 81 67  Resp:    Temp: 99.5 F (37.5 C) 98.8 F (37.1 C)  SpO2: 98% 97%   Vitals:   05/06/17 0500 05/06/17 1400 05/06/17 2012 05/07/17 0532  BP: 112/68 126/73 (!) 133/59 136/83  Pulse: 75 68 81 67  Resp: 16 16    Temp: 99 F (37.2 C) 98.8 F (37.1 C) 99.5 F (37.5 C) 98.8 F (37.1 C)  TempSrc: Oral Oral Oral Oral  SpO2: 97% 99% 98% 97%  Weight: 53.9 kg (118 lb 13.3 oz)     Height:        General: Pt is alert, awake, not in acute distress Cardiovascular: RRR, S1/S2 +, no rubs, no gallops Respiratory: bibasilar crackles, no wheeze Abdominal: Soft, NT, ND, bowel sounds + Extremities: no edema, no cyanosis   The results of significant diagnostics from this hospitalization (including imaging, microbiology, ancillary and laboratory) are listed below for reference.    Significant Diagnostic Studies: Ct Abdomen Pelvis W Contrast  Result Date: 05/04/2017 CLINICAL DATA:  Acute onset of generalized abdominal pain and nausea. EXAM: CT ABDOMEN AND PELVIS WITH CONTRAST TECHNIQUE: Multidetector CT imaging of the abdomen and pelvis was performed using the standard protocol following bolus administration of intravenous contrast. CONTRAST:  ISOVUE-300 IOPAMIDOL (ISOVUE-300) INJECTION 61% COMPARISON:  None. FINDINGS: Lower chest: Pulmonary emboli are noted within the pulmonary arteries to both lower lung lobes. There is prominence of the right side of the heart, raising concern for right heart strain. Hepatobiliary: Scattered cysts within the liver measure up to 3.7 cm in size. The patient is status post cholecystectomy, with clips noted at the gallbladder fossa.  The common bile duct remains normal in caliber. Pancreas: The pancreas is within  normal limits. Spleen: The spleen is unremarkable in appearance. Adrenals/Urinary Tract: The adrenal glands are unremarkable in appearance. Mild right renal atrophy and scarring is noted. Nonobstructing left renal stones measure up to 4 mm in size. There is no evidence of hydronephrosis. No obstructing ureteral stones are seen. No perinephric stranding is appreciated. Stomach/Bowel: Mild wall thickening at the stomach may reflect mild gastritis. The small bowel is within normal limits. The appendix is normal in caliber, without evidence of appendicitis. The colon is unremarkable in appearance. Vascular/Lymphatic: The stomach is unremarkable in appearance. There is herniation of a short segment of small bowel into a left inguinal hernia, without evidence for obstruction. The small bowel is otherwise unremarkable. The appendix is normal in caliber, without evidence of appendicitis. Scattered diverticulosis is noted along the descending and sigmoid colon, without evidence of diverticulitis. Reproductive: The bladder is mildly distended and grossly unremarkable. The uterus is unremarkable in appearance. No suspicious adnexal masses are seen. Other: No additional soft tissue abnormalities are seen. Musculoskeletal: No acute osseous abnormalities are identified. The visualized musculature is unremarkable in appearance. IMPRESSION: 1. Pulmonary emboli within the pulmonary arteries to both lower lung lobes. Prominence of the right side of the heart raises concern for right heart strain. 2. Mild wall thickening at the stomach may reflect mild gastritis. 3. Herniation of a short segment of small bowel into a left inguinal hernia, without evidence for obstruction. 4. Scattered hepatic cysts noted. Mild right renal atrophy and scarring. Nonobstructing left renal stones seen. 5. Scattered diverticulosis along the descending and sigmoid colon, without evidence of diverticulitis. Critical Value/emergent results were called by  telephone at the time of interpretation on 05/04/2017 at 1:57 am to Dr. Judd Lien, who verbally acknowledged these results. Electronically Signed   By: Roanna Raider M.D.   On: 05/04/2017 02:10   US Venous Img Lower Bilateral  Result Date: 05/04/2017 CLINICAL DATA:  Pulmonary embolism.  Evaluate for DVT. EXAM: BILATERAL LOWER EXTREMITY VENOUS DOPPLER ULTRASOUND TECHNIQUE: Gray-scale sonography with graded compression, as well as color Doppler and duplex ultrasound were performed to evaluate the lower extremity deep venous systems from the level of the common femoral vein and including the common femoral, femoral, profunda femoral, popliteal and calf veins including the posterior tibial, peroneal and gastrocnemius veins when visible. The superficial great saphenous vein was also interrogated. Spectral Doppler was utilized to evaluate flow at rest and with distal augmentation maneuvers in the common femoral, femoral and popliteal veins. COMPARISON:  None. FINDINGS: RIGHT LOWER EXTREMITY Common Femoral Vein: No evidence of thrombus. Normal compressibility, respiratory phasicity and response to augmentation. Saphenofemoral Junction: No evidence of thrombus. Normal compressibility and flow on color Doppler imaging. Profunda Femoral Vein: No evidence of thrombus. Normal compressibility and flow on color Doppler imaging. Femoral Vein: There is hypoechoic occlusive thrombus seen within the proximal mid aspects the right femoral vein with partial recanalization of the distal aspect. Popliteal Vein: No evidence of thrombus. Normal compressibility, respiratory phasicity and response to augmentation. Calf Veins: No evidence of thrombus. Normal compressibility and flow on color Doppler imaging. Superficial Great Saphenous Vein: There is hypoechoic occlusive thrombus within the greater saphenous vein at the level of thigh. (Images 19 through 22). The greater saphenous vein appears patent distally (image 31). Other Findings:  None.  LEFT LOWER EXTREMITY Common Femoral Vein: No evidence of thrombus. Normal compressibility, respiratory phasicity and response to augmentation. Saphenofemoral Junction: No evidence of  thrombus. Normal compressibility and flow on color Doppler imaging. Profunda Femoral Vein: No evidence of thrombus. Normal compressibility and flow on color Doppler imaging. Femoral Vein: There is hypoechoic occlusive thrombus seen throughout the imaged course of the left femoral vein. Popliteal Vein: There is hypoechoic occlusive thrombus seen within the left popliteal vein. Calf Veins: Appear patent where imaged. Superficial Great Saphenous Vein: No evidence of thrombus. Normal compressibility. Venous Reflux:  None. Other Findings:  None. IMPRESSION: 1. Examination is positive for predominantly occlusive DVT involving the bilateral femoral veins as well as the left popliteal vein. 2. Occlusive SVT involving the right greater saphenous vein. Electronically Signed   By: Simonne Come M.D.   On: 05/04/2017 09:07   Dg Chest Portable 1 View  Result Date: 05/04/2017 CLINICAL DATA:  Tachypnea EXAM: PORTABLE CHEST 1 VIEW COMPARISON:  Chest radiograph 08/23/2013 FINDINGS: Shallow lung inflation with bibasilar atelectasis. No focal consolidation. No pneumothorax or sizable pleural effusion. IMPRESSION: Shallow lung inflation with bibasilar atelectasis. Electronically Signed   By: Deatra Robinson M.D.   On: 05/04/2017 01:31     Microbiology: Recent Results (from the past 240 hour(s))  Culture, Urine     Status: Abnormal   Collection Time: 05/03/17  8:45 PM  Result Value Ref Range Status   Specimen Description   Final    URINE, RANDOM Performed at [facility], [address], [place], [place] [address]    Special Requests   Final    NONE Performed at [facility], [address], [place], [place] [address]    Culture MULTIPLE SPECIES PRESENT, SUGGEST RECOLLECTION (A)  Final   Report Status 05/05/2017 FINAL  Final      Labs: Basic Metabolic Panel: Recent Labs  Lab 05/03/17 2330 05/05/17 0656 05/07/17 0611  NA 140 137 140  K 4.2 3.6 3.3*  CL 103 103 108  CO2 26 24 23   GLUCOSE 111* 81 89  BUN 15 13 9   CREATININE 0.62 0.64 0.60  CALCIUM 9.7 8.8* 8.6*   Liver Function Tests: Recent Labs  Lab 05/03/17 2330  AST 19  ALT 14  ALKPHOS 89  BILITOT 0.7  PROT 8.6*  ALBUMIN 4.2   Recent Labs  Lab 05/03/17 2330  LIPASE 38   No results for input(s): AMMONIA in the last 168 hours. CBC: Recent Labs  Lab 05/03/17 2330 05/05/17 0656 05/06/17 0327 05/07/17 0611  WBC 7.8 6.5 5.6 4.0  NEUTROABS 5.6  --   --   --   HGB 14.5 11.8* 10.3* 10.9*  HCT 44.9 38.0 31.7* 34.4*  MCV 88.2 89.8 88.3 88.4  PLT 202 246 244 291   Cardiac Enzymes: Recent Labs  Lab 05/03/17 2330 05/04/17 0533 05/04/17 1203 05/04/17 1750  TROPONINI <0.03 <0.03 <0.03 <0.03   BNP: Invalid input(s): POCBNP CBG: No results for input(s): GLUCAP in the last 168 hours.  Time coordinating discharge:  Greater than 30 minutes  Signed:  Catarina Hartshorn, DO [facility] Pager: [phone] 05/07/2017, 9:22 AM

## 2017-05-07 NOTE — Progress Notes (Signed)
Patient's daughter states understanding of discharge instructions, prescription given

## 2017-05-07 NOTE — Evaluation (Signed)
Physical Therapy Evaluation Patient Details Name: Julia Patel MRN: [id number] DOB: [birth year] Today's Date: 05/07/2017   History of Present Illness  Julia Patel is a 77 y.o. female with medical history of dementia, right lower extremity DVT, hypertension, hyperlipidemia presenting with 2-3-day history of abdominal pain.  The family initially felt this may been due to eating some unusual red meat.  Unfortunately, the patient is unable to provide any significant history secondary to her dementia.  All the history is obtained from review of the medical record and speaking with the patient's granddaughter is at the bedside.  The patient has not had any recent travels or eating undercooked or unusual foods.  There is not been any fevers, chills, chest pain, shortness breath, coughing, hemoptysis, nausea, vomiting, diarrhea, dysuria, hematuria, headaches.  The patient is usually ambulatory without any assistive devices.  There is no history of malignancy.  There is no recent travels.  CT of the abdomen and pelvis was obtained in the emergency department.  There was incidental finding of bilateral lower lobe pulmonary emboli with evidence of right heart strain.  As result, the patient was admitted for further evaluation and treatment.  The patient has not had any recent surgeries or any new changes in her medications recently.    Clinical Impression  Patient functioning near baseline for functional mobility and gait, requires constant verbal/tacitle cueing to follow directions and redirecting attention for completing functional tasks.  Patient limited for ambulation mostly due to c/o fatigue.  Plan:  Patient to be discharged home today and discharged from physical therapy to care of nursing.    Follow Up Recommendations Home health PT;Supervision for mobility/OOB    Equipment Recommendations  None recommended by PT    Recommendations for Other Services       Precautions / Restrictions  Precautions Precautions: Fall Restrictions Weight Bearing Restrictions: No      Mobility  Bed Mobility Overal bed mobility: Needs Assistance Bed Mobility: Supine to Sit     Supine to sit: Min assist;Mod assist        Transfers Overall transfer level: Needs assistance Equipment used: Rolling walker (2 wheeled) Transfers: Sit to/from [employer] Sit to Stand: Min assist Stand pivot transfers: Min assist       General transfer comment: requires frequent verbal/tactile cueing for safety and redirection to tasks  Ambulation/Gait Ambulation/Gait assistance: Min assist Ambulation Distance (Feet): 35 Feet Assistive device: Rolling walker (2 wheeled) Gait Pattern/deviations: Decreased step length - right;Decreased step length - left;Decreased stride length   Gait velocity interpretation: Below normal speed for age/gender General Gait Details: demonstrates slow slightly labored cadence, limited secondary to c/o fatigue  Stairs            Wheelchair Mobility    Modified Rankin (Stroke Patients Only)       Balance Overall balance assessment: Needs assistance Sitting-balance support: Feet supported;No upper extremity supported Sitting balance-Leahy Scale: Good     Standing balance support: Bilateral upper extremity supported;During functional activity Standing balance-Leahy Scale: Fair Standing balance comment: with RW                             Pertinent Vitals/Pain Pain Assessment: No/denies pain    Home Living Family/patient expects to be discharged to:: Private residence Living Arrangements: Children   Type of Home: House Home Access: Level entry     Home Layout: One level Home Equipment: Cane - single point;Walker -  standard;Bedside commode Additional Comments: "above information per patient"    Prior Function Level of Independence: Independent with assistive device(s)         Comments: Ambulates household  distances without assistive device per patient     Hand Dominance        Extremity/Trunk Assessment   Upper Extremity Assessment Upper Extremity Assessment: Generalized weakness    Lower Extremity Assessment Lower Extremity Assessment: Generalized weakness    Cervical / Trunk Assessment Cervical / Trunk Assessment: Normal  Communication   Communication: No difficulties  Cognition Arousal/Alertness: Awake/alert Behavior During Therapy: WFL for tasks assessed/performed Overall Cognitive Status: Within Functional Limits for tasks assessed                                 General Comments: slow to follow directions and requires repeated verbal cues      General Comments      Exercises     Assessment/Plan    PT Assessment Patient needs continued PT services  PT Problem List Decreased strength;Decreased activity tolerance;Decreased balance;Decreased mobility       PT Treatment Interventions Therapeutic activities    PT Goals (Current goals can be found in the Care Plan section)  Acute Rehab PT Goals Patient Stated Goal: return home PT Goal Formulation: With patient Time For Goal Achievement: 05/07/17 Potential to Achieve Goals: Good    Frequency     Barriers to discharge        Co-evaluation               AM-PAC PT "6 Clicks" Daily Activity  Outcome Measure Difficulty turning over in bed (including adjusting bedclothes, sheets and blankets)?: A Little Difficulty moving from lying on back to sitting on the side of the bed? : A Lot Difficulty sitting down on and standing up from a chair with arms (e.g., wheelchair, bedside commode, etc,.)?: A Little Help needed moving to and from a bed to chair (including a wheelchair)?: A Little Help needed walking in hospital room?: A Little Help needed climbing 3-5 steps with a railing? : A Lot 6 Click Score: 16    End of Session   Activity Tolerance: Patient tolerated treatment well;Patient limited  by fatigue Patient left: in chair;with call bell/phone within reach;with family/visitor present Nurse Communication: Mobility status;Other (comment)(RN notified patient left up in chair) PT Visit Diagnosis: Unsteadiness on feet (R26.81);Other abnormalities of gait and mobility (R26.89);Muscle weakness (generalized) (M62.81)    Time: [id number] PT Time Calculation (min) (ACUTE ONLY): 26 min   Charges:   PT Evaluation $PT Eval Moderate Complexity: 1 Mod PT Treatments $Therapeutic Activity: 23-37 mins   PT G Codes:        11:57 AM, 05/07/17 Ocie BobJames Kace Hartje, MPT Physical Therapist [facility] [phone] office [phone] mobile phone

## 2017-05-07 NOTE — Discharge Instructions (Signed)
Information on my medicine - ELIQUIS (apixaban)  This medication education was reviewed with me or my healthcare representative as part of my discharge preparation.  The pharmacist that spoke with me during my hospital stay was:  Tad MooreSteven C Haniel Fix, [facility]  Why was Eliquis prescribed for you? Eliquis was prescribed to treat blood clots that may have been found in the veins of your legs (deep vein thrombosis) or in your lungs (pulmonary embolism) and to reduce the risk of them occurring again.  What do You need to know about Eliquis ? The starting dose is 10 mg (two 5 mg tablets) taken TWICE daily for the FIRST SEVEN (7) DAYS, then on (enter date)  ***  the dose is reduced to ONE 5 mg tablet taken TWICE daily.  Eliquis may be taken with or without food.   Try to take the dose about the same time in the morning and in the evening. If you have difficulty swallowing the tablet whole please discuss with your pharmacist how to take the medication safely.  Take Eliquis exactly as prescribed and DO NOT stop taking Eliquis without talking to the doctor who prescribed the medication.  Stopping may increase your risk of developing a new blood clot.  Refill your prescription before you run out.  After discharge, you should have regular check-up appointments with your healthcare provider that is prescribing your Eliquis.    What do you do if you miss a dose? If a dose of ELIQUIS is not taken at the scheduled time, take it as soon as possible on the same day and twice-daily administration should be resumed. The dose should not be doubled to make up for a missed dose.  Important Safety Information A possible side effect of Eliquis is bleeding. You should call your healthcare provider right away if you experience any of the following: ? Bleeding from an injury or your nose that does not stop. ? Unusual colored urine (red or dark Ned) or unusual colored stools (red or black). ? Unusual bruising for  unknown reasons. ? A serious fall or if you hit your head (even if there is no bleeding).  Some medicines may interact with Eliquis and might increase your risk of bleeding or clotting while on Eliquis. To help avoid this, consult your healthcare provider or pharmacist prior to using any new prescription or non-prescription medications, including herbals, vitamins, non-steroidal anti-inflammatory drugs (NSAIDs) and supplements.  This website has more information on Eliquis (apixaban): http://www.eliquis.com/eliquis/home

## 2017-05-07 NOTE — Care Management Important Message (Signed)
Important Message  Patient Details  Name: Julia BordersMargaret S Patel MRN: [id number] Date of Birth: 11/09/1940   Medicare Important Message Given:  Yes    Malcolm MetroChildress, Ardice Boyan Demske, RN 05/07/2017, 10:53 AM

## 2017-05-09 LAB — FACTOR 5 LEIDEN

## 2017-05-14 LAB — PROTHROMBIN GENE MUTATION

## 2019-10-01 IMAGING — US US EXTREM LOW VENOUS BILAT
1 series · 13 of 24 positions shown · non-contrast
Comparison: None.

CLINICAL DATA: Pulmonary embolism.  Evaluate for DVT.



[Series 1: us extrem low venous bilat · 0.05mm/px · 13 of 63 slices shown]
[im 1/63]
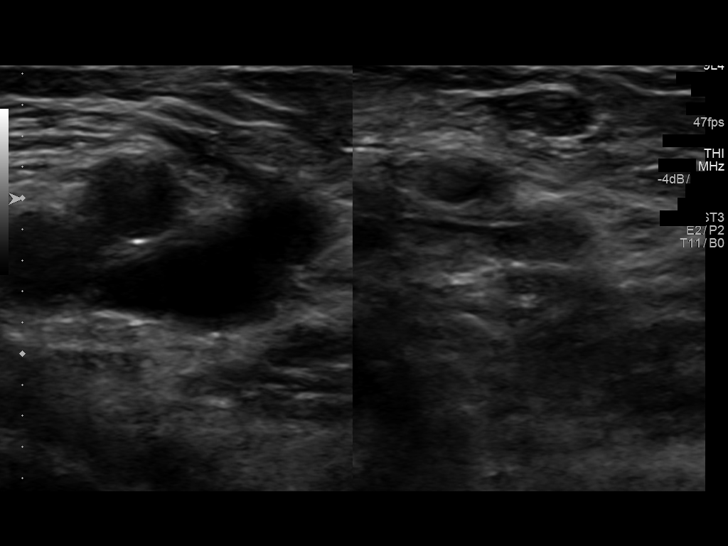
[im 6/63]
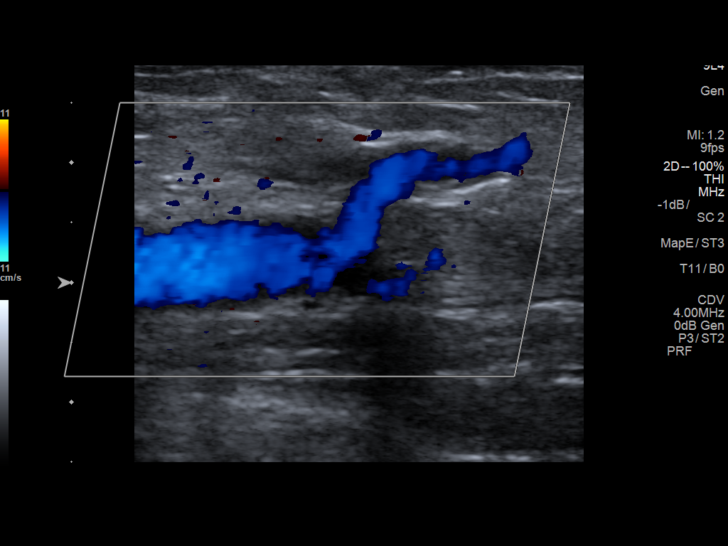
[im 11/63]
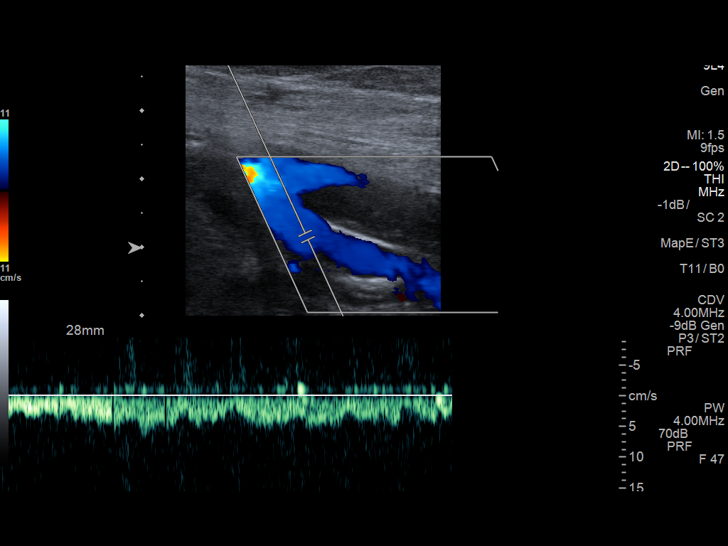
[im 17/63]
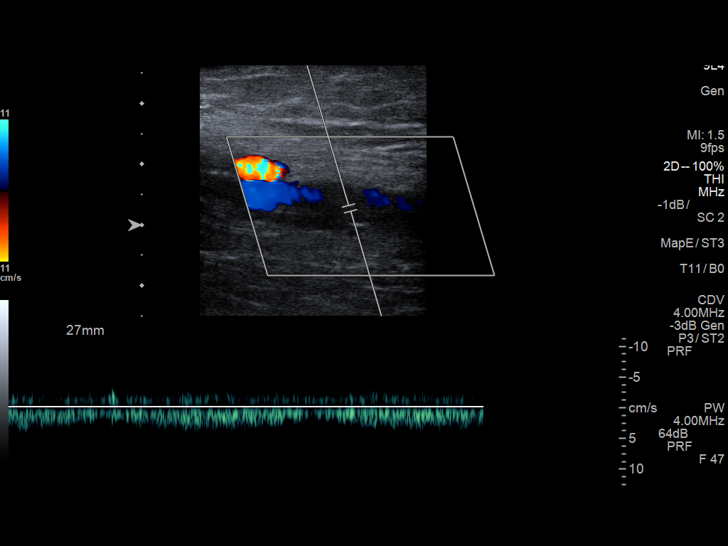
[im 22/63]
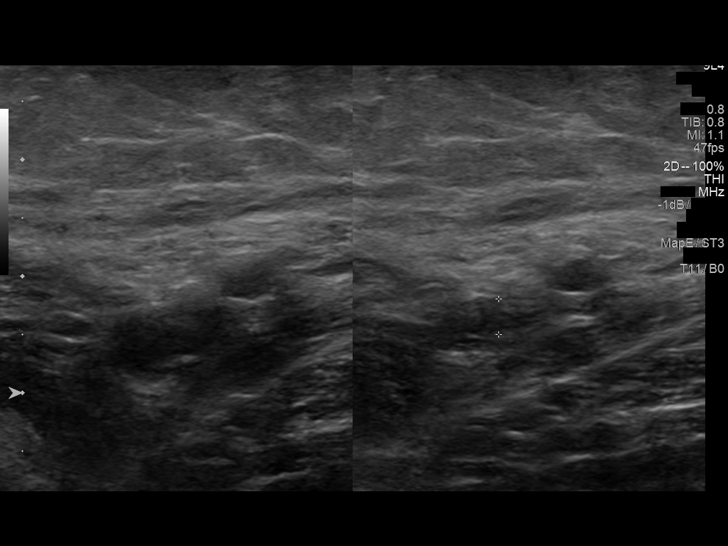
[im 27/63]
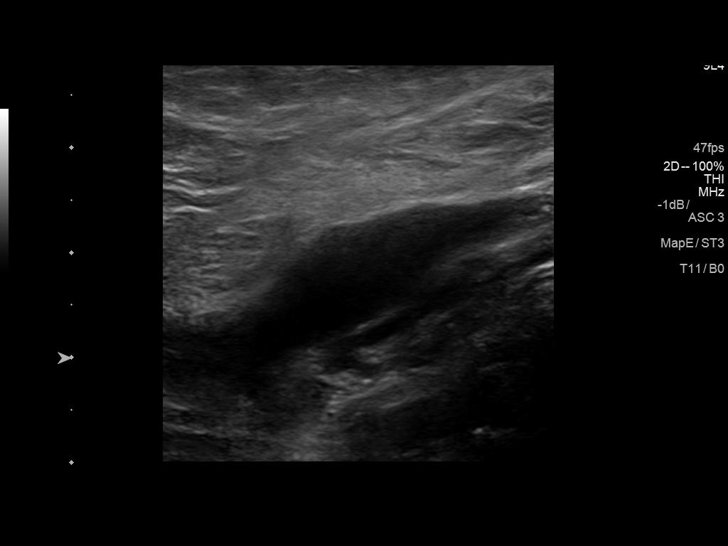
[im 33/63]
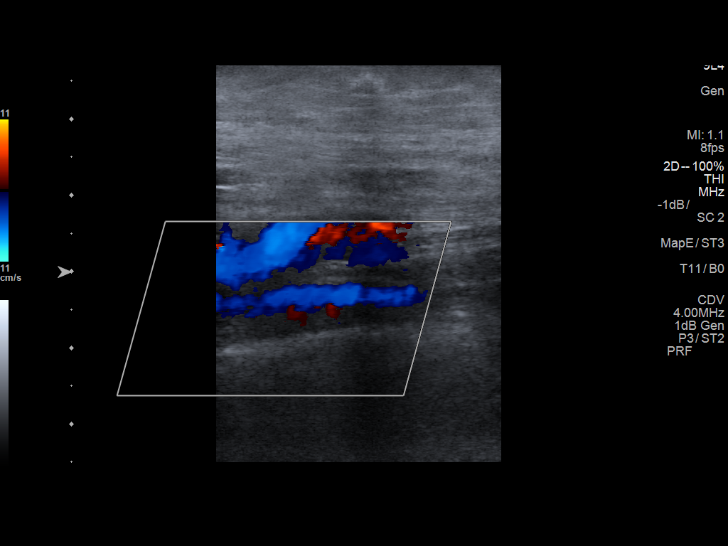
[im 36/63]
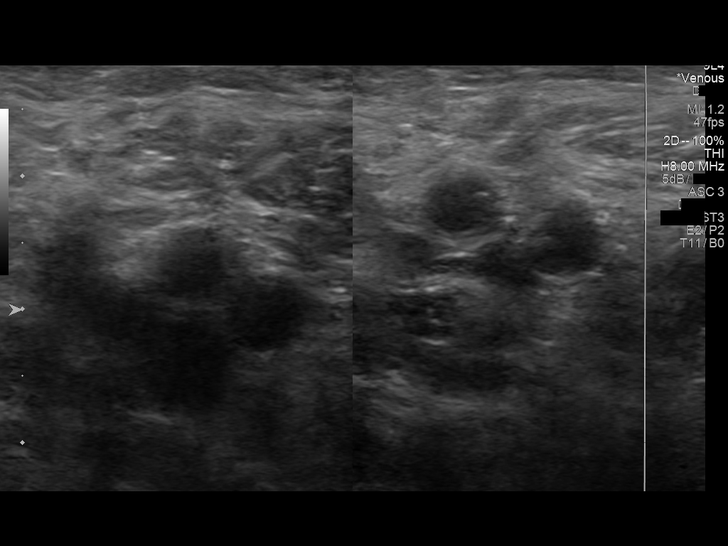
[im 41/63]
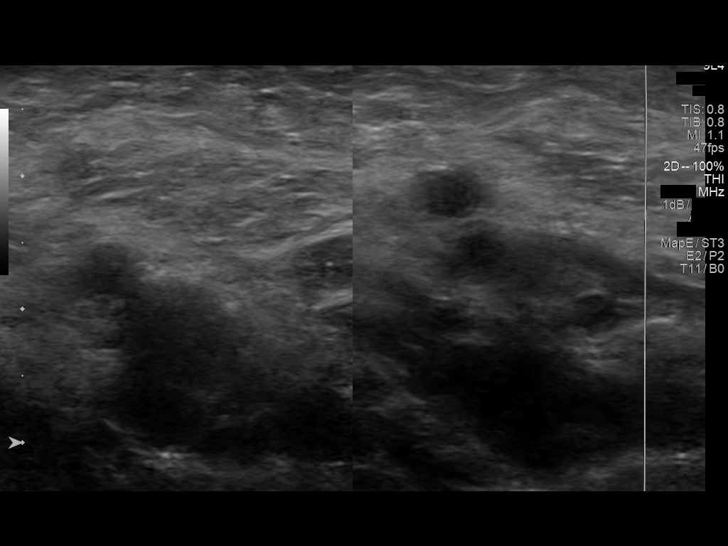
[im 46/63]
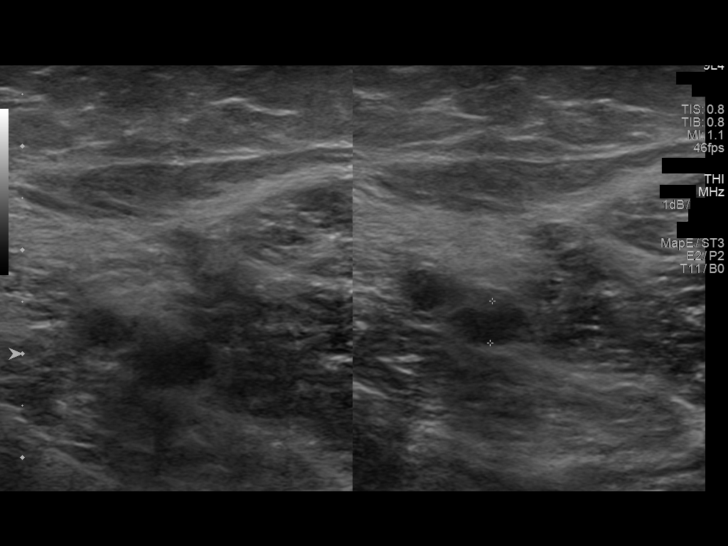
[im 52/63]
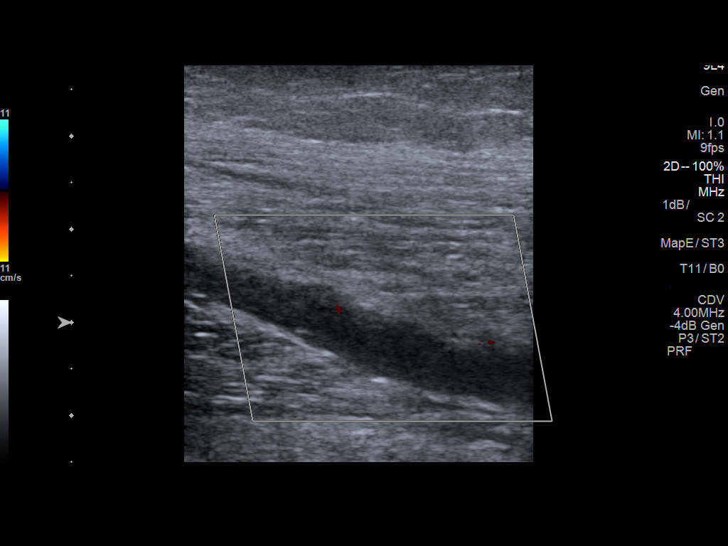
[im 57/63]
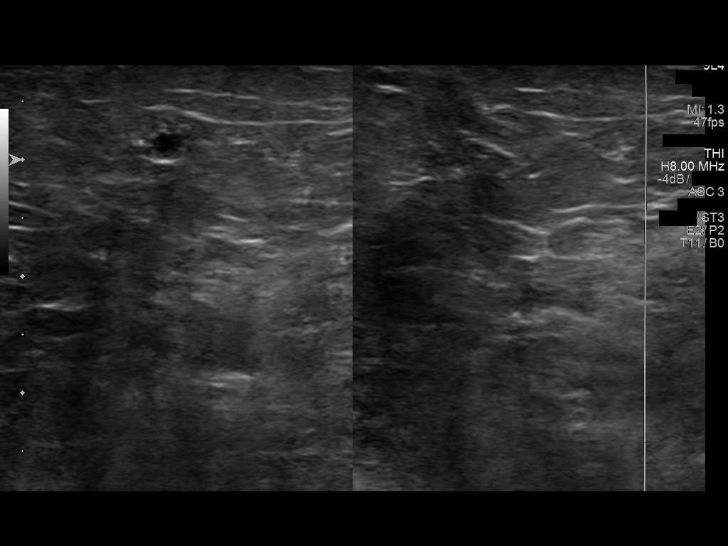
[im 63/63]
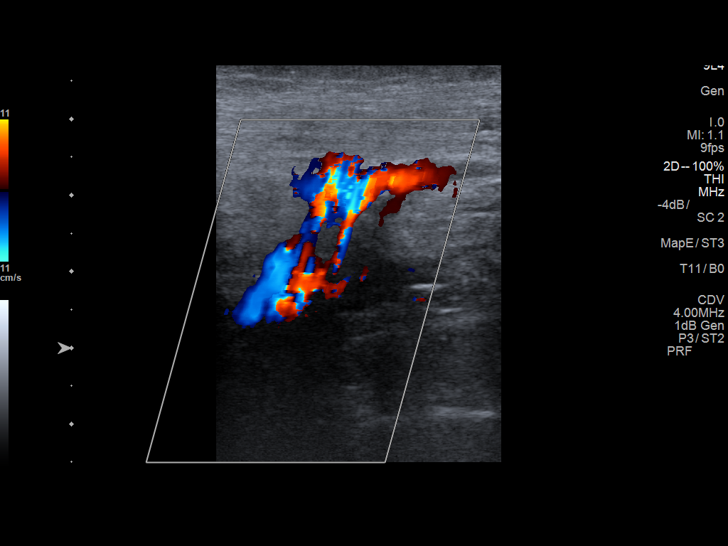

[13 of 24 positions shown; findings below may reference images not displayed]

FINDINGS: RIGHT LOWER EXTREMITY

Common Femoral Vein: No evidence of thrombus. Normal
compressibility, respiratory phasicity and response to augmentation.

Saphenofemoral Junction: No evidence of thrombus. Normal
compressibility and flow on color Doppler imaging.

Profunda Femoral Vein: No evidence of thrombus. Normal
compressibility and flow on color Doppler imaging.

Femoral Vein: There is hypoechoic occlusive thrombus seen within the
proximal mid aspects the right femoral vein with partial
recanalization of the distal aspect.

Popliteal Vein: No evidence of thrombus. Normal compressibility,
respiratory phasicity and response to augmentation.

Calf Veins: No evidence of thrombus. Normal compressibility and flow
on color Doppler imaging.

Superficial Great Saphenous Vein: There is hypoechoic occlusive
thrombus within the greater saphenous vein at the level of thigh.
(Images 19 through 22). The greater saphenous vein appears patent
distally (image 31).

Other Findings:  None.

LEFT LOWER EXTREMITY

Common Femoral Vein: No evidence of thrombus. Normal
compressibility, respiratory phasicity and response to augmentation.

Saphenofemoral Junction: No evidence of thrombus. Normal
compressibility and flow on color Doppler imaging.

Profunda Femoral Vein: No evidence of thrombus. Normal
compressibility and flow on color Doppler imaging.

Femoral Vein: There is hypoechoic occlusive thrombus seen throughout
the imaged course of the left femoral vein.

Popliteal Vein: There is hypoechoic occlusive thrombus seen within
the left popliteal vein.

Calf Veins: Appear patent where imaged.

Superficial Great Saphenous Vein: No evidence of thrombus. Normal
compressibility.

Venous Reflux:  None.

Other Findings:  None.
IMPRESSION: 1. Examination is positive for predominantly occlusive DVT involving
the bilateral femoral veins as well as the left popliteal vein.
2. Occlusive SVT involving the right greater saphenous vein.

## 2020-01-21 DEATH — deceased
